# Patient Record
Sex: Male | Born: 1990 | Race: Black or African American | Hispanic: No | Marital: Married | Smoking: Current every day smoker
Health system: Southern US, Community
[De-identification: ages and names within clinical notes are randomized; demographics above are authoritative.]

## PROBLEM LIST (undated history)

## (undated) DIAGNOSIS — I10 Essential (primary) hypertension: Secondary | ICD-10-CM

## (undated) DIAGNOSIS — G4730 Sleep apnea, unspecified: Secondary | ICD-10-CM

## (undated) HISTORY — PX: NOSE SURGERY: SHX723

## (undated) HISTORY — PX: NASAL SINUS SURGERY: SHX719

---
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
## 2016-09-18 ENCOUNTER — Encounter (HOSPITAL_COMMUNITY): Payer: Self-pay | Admitting: Emergency Medicine

## 2016-09-18 ENCOUNTER — Ambulatory Visit (HOSPITAL_COMMUNITY)
Admission: EM | Admit: 2016-09-18 | Discharge: 2016-09-18 | Disposition: A | Discharge status: Home / Self Care | Payer: 59

## 2016-09-18 DIAGNOSIS — R197 Diarrhea, unspecified: Secondary | ICD-10-CM | POA: Diagnosis not present

## 2016-09-18 NOTE — ED Triage Notes (Signed)
Onset Monday night of fever, then diarrhea.  Fever has been intermittent since onset.  Denies vomiting.  No one else sick at home

## 2016-09-18 NOTE — ED Provider Notes (Signed)
CSN: [id number]     Arrival date & time 09/18/16  1450 History   None    Chief Complaint  Patient presents with  . Diarrhea   (Consider location/radiation/quality/duration/timing/severity/associated sxs/prior Treatment) Patient c/o fever 2 days ago. Patient is c/o diarrhea at present.  Denies nausea.   The history is provided by the patient.  Diarrhea  Quality:  Watery Severity:  Moderate Onset quality:  Sudden Duration:  2 days Timing:  Intermittent Relieved by:  Nothing Worsened by:  Nothing Ineffective treatments:  None tried   History reviewed. No pertinent past medical history. Past Surgical History:  Procedure Laterality Date  . NASAL SINUS SURGERY     No family history on file. Social History  Substance Use Topics  . Smoking status: Never Smoker  . Smokeless tobacco: Not on file  . Alcohol use Yes    Review of Systems  Constitutional: Negative.   HENT: Negative.   Eyes: Negative.   Respiratory: Negative.   Cardiovascular: Negative.   Gastrointestinal: Positive for diarrhea.  Endocrine: Negative.   Genitourinary: Negative.   Musculoskeletal: Negative.   Allergic/Immunologic: Negative.   Neurological: Negative.   Hematological: Negative.   Psychiatric/Behavioral: Negative.     Allergies  Amoxicillin  Home Medications   Prior to Admission medications   Not on File   Meds Ordered and Administered this Visit  Medications - No data to display  BP (!) 146/99 (BP Location: Left Arm) Comment (BP Location): large cuff  Pulse 90   Temp 99.1 F (37.3 C) (Oral)   Resp (!) 22   SpO2 100%  No data found.   Physical Exam  Constitutional: He appears well-developed and well-nourished.  HENT:  Head: Normocephalic and atraumatic.  Eyes: Conjunctivae and EOM are normal. Pupils are equal, round, and reactive to light.  Neck: Normal range of motion. Neck supple.  Cardiovascular: Normal rate, regular rhythm and normal heart sounds.   Pulmonary/Chest:  Effort normal and breath sounds normal.  Abdominal: Soft. Bowel sounds are normal.  Nursing note and vitals reviewed.   Urgent Care Course     Procedures (including critical care time)  Labs Review Labs Reviewed - No data to display  Imaging Review No results found.   Visual Acuity Review  Right Eye Distance:   Left Eye Distance:   Bilateral Distance:    Right Eye Near:   Left Eye Near:    Bilateral Near:         MDM   1. Diarrhea, unspecified type    Reassurance Advised patient to take metamucil otc and probiotics  Note for work      Deatra Canter, [place] 09/18/16 1601

## 2017-06-06 ENCOUNTER — Encounter (HOSPITAL_COMMUNITY): Payer: Self-pay | Admitting: Emergency Medicine

## 2017-06-06 ENCOUNTER — Other Ambulatory Visit: Payer: Self-pay

## 2017-06-06 ENCOUNTER — Emergency Department (HOSPITAL_COMMUNITY)
Admission: EM | Admit: 2017-06-06 | Discharge: 2017-06-06 | Disposition: A | Discharge status: Home / Self Care | Payer: 59 | Attending: Emergency Medicine | Admitting: Emergency Medicine

## 2017-06-06 DIAGNOSIS — B9789 Other viral agents as the cause of diseases classified elsewhere: Secondary | ICD-10-CM

## 2017-06-06 DIAGNOSIS — J029 Acute pharyngitis, unspecified: Secondary | ICD-10-CM | POA: Insufficient documentation

## 2017-06-06 DIAGNOSIS — J069 Acute upper respiratory infection, unspecified: Secondary | ICD-10-CM | POA: Insufficient documentation

## 2017-06-06 LAB — RAPID STREP SCREEN (MED CTR MEBANE ONLY): Streptococcus, Group A Screen (Direct): NEGATIVE

## 2017-06-06 NOTE — Discharge Instructions (Signed)
Alternate between Tylenol and ibuprofen as needed for pain or fever. Gargle warm salt water and spit it out. It is very important to stay hydrated!  Follow up with your primary care doctor in 5-7 days for recheck of ongoing symptoms and return to emergency department if any new or worsening of symptoms develop or you have any additional concerns.

## 2017-06-06 NOTE — ED Provider Notes (Signed)
[facility]ERGENCY [facility] Provider Note   CSN: [id number] Arrival date & time: 06/06/17  2144     History   Chief Complaint Chief Complaint  Patient presents with  . Sore Throat    Nasal Congestion   . Cough    HPI Margarette AsalDashawn J Hribar is a 26 y.o. male.  The history is provided by the patient and medical records. No language interpreter was used.  Sore Throat  Pertinent negatives include no chest pain, no abdominal pain and no shortness of breath.  Cough  Associated symptoms include sore throat. Pertinent negatives include no chest pain, no ear pain, no myalgias and no shortness of breath.   Margarette AsalDashawn J Madrazo is an otherwise healthy 26 y.o. male who presents to the Emergency Department complaining of sore throat which began yesterday.  Associated symptoms include intermittently productive cough and nasal congestion.  He endorses having temperature of 101 oral at home yesterday.  He has taken no antipyretic medications.  Temperature of 98.4 in triage and reports having no temperature during the day today at home either.  He has been taking over-the-counter allergy medication and NyQuil with some relief.  He reports working outside in the 30 degree temperature and states that when he went to work today, the cold weather exacerbated his symptoms and he informed his boss he needed to leave, prompting him to come to the emergency department today.  No known sick contacts.  History reviewed. No pertinent past medical history.  There are no active problems to display for this patient.   Past Surgical History:  Procedure Laterality Date  . NASAL SINUS SURGERY         Home Medications    Prior to Admission medications   Not on File    Family History No family history on file.  Social History Social History   Tobacco Use  . Smoking status: Never Smoker  . Smokeless tobacco: Never Used  Substance Use Topics  . Alcohol use: Yes  . Drug use: No      Allergies   Amoxicillin   Review of Systems Review of Systems  Constitutional: Positive for fever.  HENT: Positive for congestion and sore throat. Negative for ear pain and trouble swallowing.   Respiratory: Positive for cough. Negative for shortness of breath.   Cardiovascular: Negative for chest pain.  Gastrointestinal: Negative for abdominal pain, diarrhea, nausea and vomiting.  Musculoskeletal: Negative for myalgias.  Skin: Negative for rash.  Neurological: Negative for weakness.     Physical Exam Updated Vital Signs BP (!) 153/88 (BP Location: Right Arm)   Pulse 90   Temp 98.4 F (36.9 C) (Oral)   Resp 18   SpO2 98%   Physical Exam  Constitutional: He is oriented to person, place, and time. He appears well-developed and well-nourished. No distress.  HENT:  Head: Normocephalic and atraumatic.  OP with erythema, no exudates or tonsillar hypertrophy. + nasal congestion with mucosal edema.   Neck: Normal range of motion. Neck supple.  Cardiovascular: Normal rate, regular rhythm and normal heart sounds.  Pulmonary/Chest: Effort normal.  Lungs are clear to auscultation bilaterally - no w/r/r  Abdominal: Soft. He exhibits no distension. There is no tenderness.  Musculoskeletal: Normal range of motion.  Neurological: He is alert and oriented to person, place, and time.  Skin: Skin is warm and dry. He is not diaphoretic.  Nursing note and vitals reviewed.    ED Treatments / Results  Labs (all labs ordered are  listed, but only abnormal results are displayed) Labs Reviewed  RAPID STREP SCREEN (NOT AT [facility])  CULTURE, GROUP A STREP [facility])    EKG  EKG Interpretation None       Radiology No results found.  Procedures Procedures (including critical care time)  Medications Ordered in ED Medications - No data to display   Initial Impression / Assessment and Plan / ED Course  I have reviewed the triage vital signs and the nursing notes.  Pertinent labs  & imaging results that were available during my care of the patient were reviewed by me and considered in my medical decision making (see chart for details).    Margarette AsalDashawn J Stetler is a 26 y.o. male who presents to ED for sore throat and congestion which began yesterday.  On exam, patient is afebrile, hemodynamically stable with mild erythema to the oropharynx, but no exudates or tonsillar hypertrophy.  Lungs are clear to auscultation bilaterally.  Rapid strep negative.  Likely viral etiology.  Symptomatic home care instructions discussed.  PCP follow-up if no improvement.  All questions answered.   Final Clinical Impressions(s) / ED Diagnoses   Final diagnoses:  Viral URI with cough    ED Discharge Orders    None       Ward, Chase PicketJaime Pilcher, PA-C 06/06/17 2331    Mesner, Barbara CowerJason, MD 06/07/17 [phone]

## 2017-06-06 NOTE — ED Notes (Signed)
Unable to locate pt. at waiting area several times by staff.  

## 2017-06-06 NOTE — ED Triage Notes (Signed)
Patient presents with multiple complaints : sore throat , fever , productive cough and nasal congestion onset yesterday unrelieved by OTC medications .

## 2017-06-09 LAB — CULTURE, GROUP A STREP (THRC)

## 2017-09-18 ENCOUNTER — Encounter (HOSPITAL_COMMUNITY): Payer: Self-pay

## 2017-09-18 ENCOUNTER — Emergency Department (HOSPITAL_COMMUNITY)
Admission: EM | Admit: 2017-09-18 | Discharge: 2017-09-18 | Disposition: A | Discharge status: Home / Self Care | Payer: Managed Care, Other (non HMO) | Attending: Emergency Medicine | Admitting: Emergency Medicine

## 2017-09-18 DIAGNOSIS — R21 Rash and other nonspecific skin eruption: Secondary | ICD-10-CM | POA: Diagnosis not present

## 2017-09-18 MED ORDER — VALACYCLOVIR HCL 1 G PO TABS: 1000.0000 mg | ORAL_TABLET | Freq: Three times a day (TID) | ORAL | 0 refills | Status: DC

## 2017-09-18 MED ORDER — MUPIROCIN CALCIUM 2 % EX CREA: 1.0000 "application " | TOPICAL_CREAM | Freq: Two times a day (BID) | CUTANEOUS | 0 refills | Status: DC

## 2017-09-18 NOTE — ED Triage Notes (Signed)
Pt has a rash under his R breast that has been there for three days, painful, possibly shingles?

## 2017-09-18 NOTE — Discharge Instructions (Signed)
Take Valtrex until completed for suspected shingles. Apply mupirocin cream twice daily to affected area. Try to clean the affected areas well. Shower after sweating as soon as possible. Please return to the emergency department if you develop any new or worsening symptoms.

## 2017-09-18 NOTE — ED Provider Notes (Signed)
[facility] EMERGENCY DEPARTMENT Provider Note   CSN: [id number] Arrival date & time: 09/18/17  0144     History   Chief Complaint Chief Complaint  Patient presents with  . Rash    HPI Wesley Wheeler is a 27 y.o. male who presents with a 2-3-day history of rash under his right breast.  He reports the rash is mildly painful.  He denies any drainage.  He is not tried anything at home for symptoms.  He denies any fever or other symptoms.  It is not itchy.  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  He has not been  under any increased stress recently.  He has had chickenpox in his life.  He denies any other chest pain, shortness of breath, abdominal pain, nausea, vomiting.  HPI  History reviewed. No pertinent past medical history.  There are no active problems to display for this patient.   Past Surgical History:  Procedure Laterality Date  . NASAL SINUS SURGERY          Home Medications    Prior to Admission medications   Medication Sig Start Date End Date Taking? Authorizing Provider  mupirocin cream (BACTROBAN) 2 % Apply 1 application topically 2 (two) times daily. 09/18/17   Emeril Stille, Waylan Boga, PA-C  valACYclovir (VALTREX) 1000 MG tablet Take 1 tablet (1,000 mg total) by mouth 3 (three) times daily. 09/18/17   Emi Holes, PA-C    Family History No family history on file.  Social History Social History   Tobacco Use  . Smoking status: Never Smoker  . Smokeless tobacco: Never Used  Substance Use Topics  . Alcohol use: Yes  . Drug use: No     Allergies   Amoxicillin   Review of Systems Review of Systems  Constitutional: Negative for fever.  Respiratory: Negative for shortness of breath.   Cardiovascular: Negative for chest pain.  Gastrointestinal: Negative for abdominal pain, nausea and vomiting.  Skin: Positive for rash.     Physical Exam Updated Vital Signs BP 140/75   Pulse 89   Temp 98 F (36.7 C) (Oral)    Resp 20   SpO2 99%   Physical Exam  Constitutional: He appears well-developed and well-nourished. No distress.  HENT:  Head: Normocephalic and atraumatic.  Mouth/Throat: Oropharynx is clear and moist. No oropharyngeal exudate.  Eyes: Pupils are equal, round, and reactive to light. Conjunctivae are normal. Right eye exhibits no discharge. Left eye exhibits no discharge. No scleral icterus.  Neck: Normal range of motion. Neck supple. No thyromegaly present.  Cardiovascular: Normal rate, regular rhythm, normal heart sounds and intact distal pulses. Exam reveals no gallop and no friction rub.  No murmur heard. Pulmonary/Chest: Effort normal and breath sounds normal. No stridor. No respiratory distress. He has no wheezes. He has no rales.  Abdominal: Soft. Bowel sounds are normal. He exhibits no distension. There is no tenderness. There is no rebound and no guarding.  Musculoskeletal: He exhibits no edema.  Lymphadenopathy:    He has no cervical adenopathy.  Neurological: He is alert. Coordination normal.  Skin: Skin is warm and dry. No rash noted. He is not diaphoretic. No pallor.  Vesicular rash with mild erythematous base under the right breast, does not cross midline, painful only on palpation of one aspect, no tenderness to light touch, some vesicles seem to have a blackhead, others seem to have clear; no drainage; see photo  Psychiatric: He has a  normal mood and affect.  Nursing note and vitals reviewed.      ED Treatments / Results  Labs (all labs ordered are listed, but only abnormal results are displayed) Labs Reviewed - No data to display  EKG None  Radiology No results found.  Procedures Procedures (including critical care time)  Medications Ordered in ED Medications - No data to display   Initial Impression / Assessment and Plan / ED Course  I have reviewed the triage vital signs and the nursing notes.  Pertinent labs & imaging results that were available  during my care of the patient were reviewed by me and considered in my medical decision making (see chart for details).     Patient with rash for the past 3 days.  It is painful in certain areas.  Considering the distribution and appearance, concern for herpes zoster versus folliculitis.  Will treat with Valtrex and also cover with mupirocin.  Patient not having significant pain.  No signs SJS or TEN, or other emergent skin condition.  Return precautions discussed.  Patient understands and agrees with plan.  Patient vitals stable throughout ED course and discharged in satisfactory condition.  Final Clinical Impressions(s) / ED Diagnoses   Final diagnoses:  Rash and nonspecific skin eruption    ED Discharge Orders        Ordered    valACYclovir (VALTREX) 1000 MG tablet  3 times daily     09/18/17 0252    mupirocin cream (BACTROBAN) 2 %  2 times daily     09/18/17 0252       Emi HolesLaw, Kimanh Templeman M, PA-C 09/18/17 [id number]    Alvira MondaySchlossman, Erin, MD 09/20/17 [phone]

## 2019-04-10 ENCOUNTER — Emergency Department (HOSPITAL_COMMUNITY)
Admission: EM | Admit: 2019-04-10 | Discharge: 2019-04-10 | Disposition: A | Discharge status: Home / Self Care | Payer: Managed Care, Other (non HMO) | Attending: Emergency Medicine | Admitting: Emergency Medicine

## 2019-04-10 ENCOUNTER — Other Ambulatory Visit: Payer: Self-pay

## 2019-04-10 ENCOUNTER — Encounter (HOSPITAL_COMMUNITY): Payer: Self-pay | Admitting: Emergency Medicine

## 2019-04-10 DIAGNOSIS — R519 Headache, unspecified: Secondary | ICD-10-CM | POA: Diagnosis not present

## 2019-04-10 DIAGNOSIS — Z5321 Procedure and treatment not carried out due to patient leaving prior to being seen by health care provider: Secondary | ICD-10-CM | POA: Insufficient documentation

## 2019-04-10 DIAGNOSIS — R05 Cough: Secondary | ICD-10-CM | POA: Diagnosis present

## 2019-04-10 NOTE — ED Notes (Signed)
Pt in waiting room and asked "what the hell was taking so long?" in reference to being seen. As this EMT tried to talk with pt, pt called this EMT a "lazy bitch" who "just runs back and forth pretending to be busy." This EMT asked security to speak with pt. Pt continued to yell and was asked not to yell at staff. Pt sat for a few minutes longer before leaving [facility]. RN notified.

## 2019-04-10 NOTE — ED Triage Notes (Signed)
C/o productive cough with yellow phlegm and headache since yesterday.

## 2019-04-11 ENCOUNTER — Other Ambulatory Visit: Payer: Self-pay

## 2019-04-11 ENCOUNTER — Ambulatory Visit (INDEPENDENT_AMBULATORY_CARE_PROVIDER_SITE_OTHER): Payer: Managed Care, Other (non HMO)

## 2019-04-11 ENCOUNTER — Ambulatory Visit (HOSPITAL_COMMUNITY)
Admission: EM | Admit: 2019-04-11 | Discharge: 2019-04-11 | Disposition: A | Discharge status: Home / Self Care | Payer: Managed Care, Other (non HMO) | Attending: Emergency Medicine | Admitting: Emergency Medicine

## 2019-04-11 ENCOUNTER — Encounter (HOSPITAL_COMMUNITY): Payer: Self-pay | Admitting: Emergency Medicine

## 2019-04-11 DIAGNOSIS — R0602 Shortness of breath: Secondary | ICD-10-CM | POA: Diagnosis not present

## 2019-04-11 DIAGNOSIS — R05 Cough: Secondary | ICD-10-CM | POA: Diagnosis not present

## 2019-04-11 DIAGNOSIS — R062 Wheezing: Secondary | ICD-10-CM

## 2019-04-11 DIAGNOSIS — Z79899 Other long term (current) drug therapy: Secondary | ICD-10-CM | POA: Insufficient documentation

## 2019-04-11 DIAGNOSIS — Z20828 Contact with and (suspected) exposure to other viral communicable diseases: Secondary | ICD-10-CM | POA: Insufficient documentation

## 2019-04-11 DIAGNOSIS — R0981 Nasal congestion: Secondary | ICD-10-CM | POA: Diagnosis not present

## 2019-04-11 DIAGNOSIS — F17200 Nicotine dependence, unspecified, uncomplicated: Secondary | ICD-10-CM | POA: Insufficient documentation

## 2019-04-11 DIAGNOSIS — J3489 Other specified disorders of nose and nasal sinuses: Secondary | ICD-10-CM

## 2019-04-11 DIAGNOSIS — J40 Bronchitis, not specified as acute or chronic: Secondary | ICD-10-CM | POA: Insufficient documentation

## 2019-04-11 DIAGNOSIS — Z880 Allergy status to penicillin: Secondary | ICD-10-CM | POA: Insufficient documentation

## 2019-04-11 DIAGNOSIS — Z833 Family history of diabetes mellitus: Secondary | ICD-10-CM | POA: Insufficient documentation

## 2019-04-11 DIAGNOSIS — R0789 Other chest pain: Secondary | ICD-10-CM

## 2019-04-11 MED ORDER — HYDROCOD POLST-CPM POLST ER 10-8 MG/5ML PO SUER: 5.0000 mL | Freq: Two times a day (BID) | ORAL | 0 refills | Status: DC | PRN

## 2019-04-11 MED ORDER — FLUTICASONE PROPIONATE 50 MCG/ACT NA SUSP: 2.0000 | Freq: Every day | NASAL | 0 refills | Status: DC

## 2019-04-11 MED ORDER — AEROCHAMBER PLUS MISC: 2 refills | Status: DC

## 2019-04-11 MED ORDER — ALBUTEROL SULFATE HFA 108 (90 BASE) MCG/ACT IN AERS: 1.0000 | INHALATION_SPRAY | Freq: Four times a day (QID) | RESPIRATORY_TRACT | 0 refills | Status: DC | PRN

## 2019-04-11 MED ORDER — BENZONATATE 200 MG PO CAPS: 200.0000 mg | ORAL_CAPSULE | Freq: Three times a day (TID) | ORAL | 0 refills | Status: DC | PRN

## 2019-04-11 NOTE — ED Provider Notes (Signed)
HPI  SUBJECTIVE:  Wesley Wheeler is a 28 y.o. male who presents with 3 days of a cough productive of thick yellow phlegm, nasal congestion, rhinorrhea.  He had a sore throat and headache yesterday, but this has resolved.  He reports wheezing, and shortness of breath with coughing only.  He reports central chest pain present only with breathing described as soreness.  This started today.  He has tried Benadryl without improvement in symptoms.  There are no other aggravating or alleviating factors.  No fevers, postnasal drip, sinus pain or pressure, body aches.  No nausea, vomiting, abdominal pain, diarrhea.  No loss of sense of smell or taste.  No known exposure to Covid.  No antibiotics in the past 3 months.  No antipyretic in the past 4 to 6 hours.  No calf pain, swelling, prolonged immobilization, surgery in the past 4 weeks, hemoptysis.  He is a smoker.  No history of asthma, emphysema, COPD, diabetes, hypertension, MI, coronary disease, hypercholesterolemia, PE, DVT, chronic kidney disease, immunocompromise or HIV.  PMD: None.    History reviewed. No pertinent past medical history.  Past Surgical History:  Procedure Laterality Date  . NASAL SINUS SURGERY    . NOSE SURGERY     benign tumor    Family History  Problem Relation Age of Onset  . Hypertension Mother   . Diabetes Father     Social History   Tobacco Use  . Smoking status: Current Every Day Smoker  . Smokeless tobacco: Never Used  Substance Use Topics  . Alcohol use: Yes  . Drug use: No    No current facility-administered medications for this encounter.   Current Outpatient Medications:  .  albuterol (VENTOLIN HFA) 108 (90 Base) MCG/ACT inhaler, Inhale 1-2 puffs into the lungs every 6 (six) hours as needed for wheezing or shortness of breath., Disp: 18 g, Rfl: 0 .  benzonatate (TESSALON) 200 MG capsule, Take 1 capsule (200 mg total) by mouth 3 (three) times daily as needed for cough., Disp: 30 capsule, Rfl: 0 .   chlorpheniramine-HYDROcodone (TUSSIONEX PENNKINETIC ER) 10-8 MG/5ML SUER, Take 5 mLs by mouth every 12 (twelve) hours as needed for cough., Disp: 60 mL, Rfl: 0 .  fluticasone (FLONASE) 50 MCG/ACT nasal spray, Place 2 sprays into both nostrils daily., Disp: 16 g, Rfl: 0 .  Spacer/Aero-Holding Chambers (AEROCHAMBER PLUS) inhaler, Use as instructed, Disp: 1 each, Rfl: 2 .  valACYclovir (VALTREX) 1000 MG tablet, Take 1 tablet (1,000 mg total) by mouth 3 (three) times daily., Disp: 21 tablet, Rfl: 0  Allergies  Allergen Reactions  . Amoxicillin      ROS  As noted in HPI.   Physical Exam  BP (!) 154/98 (BP Location: Left Arm)   Pulse (!) 101   Temp 98.6 F (37 C) (Oral)   Resp 20   SpO2 96%   Constitutional: Well developed, well nourished, no acute distress Eyes:  EOMI, conjunctiva normal bilaterally HENT: Normocephalic, atraumatic,mucus membranes moist positive mucoid nasal congestion.  Erythematous, but not swollen turbinates.  No sinus tenderness.  No obvious postnasal drip. Respiratory: Fair inspiratory effort.  Rhonchi on the left side that do not clear with the coughing.  Positive wheezing left side.  No chest wall tenderness Cardiovascular: Mild regular tachycardia, no murmurs rubs or gallops GI: nondistended skin: No rash, skin intact Musculoskeletal: no deformities.  Calves symmetric, nontender, no edema.  No palpable cord. Neurologic: Alert & oriented x 3, no focal neuro deficits Psychiatric: Speech and behavior  appropriate   ED Course   Medications - No data to display  Orders Placed This Encounter  Procedures  . Novel Coronavirus, NAA (Hosp order, Send-out to Ref Lab; TAT 18-24 hrs    Standing Status:   Standing    Number of Occurrences:   1    Order Specific Question:   Is this test for diagnosis or screening    Answer:   Screening    Order Specific Question:   Symptomatic for COVID-19 as defined by CDC    Answer:   No    Order Specific Question:    Hospitalized for COVID-19    Answer:   No    Order Specific Question:   Admitted to ICU for COVID-19    Answer:   No    Order Specific Question:   Previously tested for COVID-19    Answer:   No    Order Specific Question:   Resident in a congregate (group) care setting    Answer:   No    Order Specific Question:   Employed in healthcare setting    Answer:   No  . DG Chest 2 View    Standing Status:   Standing    Number of Occurrences:   1    Order Specific Question:   Reason for Exam (SYMPTOM  OR DIAGNOSIS REQUIRED)    Answer:   r/o PNA    No results found for this or any previous visit (from the past 24 hour(s)). Dg Chest 2 View  Result Date: 04/11/2019 CLINICAL DATA:  Cough and tachycardia EXAM: CHEST - 2 VIEW COMPARISON:  None. FINDINGS: Lungs are clear. Heart is upper normal in size with pulmonary vascularity normal. No adenopathy. No bone lesions. IMPRESSION: No edema or consolidation.  Heart upper normal in size. Electronically Signed   By: Bretta BangWilliam  Woodruff III M.D.   On: 04/11/2019 11:40    ED Clinical Impression  1. Bronchitis      ED Assessment/Plan  Suspect bronchitis versus lower respiratory tract infection.  Covid test sent.  Checking x-ray to rule out pneumonia.  If negative, will send home with regularly scheduled albuterol with a spacer, for the next 4 days, then may use it as needed, Flonase, discontinue Benadryl, start Mucinex D, Tussionex for the cough at night, Tessalon for the cough during the day.  Will provide primary care list for ongoing care.  [place] Narcotic database reviewed for this patient, and feel that the risk/benefit ratio today is favorable for proceeding with a prescription for controlled substance.  No opiates in the past 2 years  Reviewed imaging independently and discussed with radiology.  No edema or consolidation.  Lungs are clear.  No evidence of pneumonia.  See radiology report for full details.  Plan as above.  Discussed imaging, MDM,  treatment plan, and plan for follow-up with patient. Discussed sn/sx that should prompt return to the ED. patient agrees with plan.   Meds ordered this encounter  Medications  . DISCONTD: chlorpheniramine-HYDROcodone (TUSSIONEX PENNKINETIC ER) 10-8 MG/5ML SUER    Sig: Take 5 mLs by mouth every 12 (twelve) hours as needed for cough.    Dispense:  60 mL    Refill:  0  . fluticasone (FLONASE) 50 MCG/ACT nasal spray    Sig: Place 2 sprays into both nostrils daily.    Dispense:  16 g    Refill:  0  . albuterol (VENTOLIN HFA) 108 (90 Base) MCG/ACT inhaler    Sig: Inhale 1-2  puffs into the lungs every 6 (six) hours as needed for wheezing or shortness of breath.    Dispense:  18 g    Refill:  0  . Spacer/Aero-Holding Chambers (AEROCHAMBER PLUS) inhaler    Sig: Use as instructed    Dispense:  1 each    Refill:  2  . benzonatate (TESSALON) 200 MG capsule    Sig: Take 1 capsule (200 mg total) by mouth 3 (three) times daily as needed for cough.    Dispense:  30 capsule    Refill:  0  . chlorpheniramine-HYDROcodone (TUSSIONEX PENNKINETIC ER) 10-8 MG/5ML SUER    Sig: Take 5 mLs by mouth every 12 (twelve) hours as needed for cough.    Dispense:  60 mL    Refill:  0    *This clinic note was created using Scientist, clinical (histocompatibility and immunogenetics). Therefore, there may be occasional mistakes despite careful proofreading.   ?    Domenick Gong, MD 04/11/19 1203

## 2019-04-11 NOTE — ED Triage Notes (Signed)
Complaints of chest congestion and cough.  Symptoms started Friday with a sore throat, no sore throat now.  Patient has had a headache.  Productive cough "golden" thick.   Denies fever.   Very specific central chest pain.  Hurts worse with inhale and exhale to any extreme.    Sob after coughing episodes

## 2019-04-11 NOTE — Discharge Instructions (Addendum)
2 puffs from your albuterol every 4 hours for the next 2 days, and then every 6 hours for 2 days after that, then you may use it as needed.  Use your spacer with your inhaler.  Tessalon for the cough during the day, Tussionex for the cough at night.  Stop Benadryl, start Mucinex or Mucinex D.  Push plenty of extra fluids.  Flonase will help with your nasal congestion.  Your Covid test will take a day or 2 to come back.  Your chest x-ray was negative for pneumonia today, so I have decided to hold off on antibiotics for now.  If you are not getting any better in a week, return here and we can consider antibiotics at that time  Below is a list of primary care practices who are taking new patients for you to follow-up with.  [facility] at [facility] [address] [place], [place] [address] [phone]  [place], [place] [address] [phone]  [facility] [phone] [place] [address]  [facility]: [place]  [phone]  [place] [facility]: [place]   [phone]  [facility]: [address]  [place]  [phone]  [place] [facility] : [address] [place] [phone]  [facility]: [address] [place] [address] [phone]  [facility]: [place] [phone]  Dr. Blanchie Serve [place]  [phone]  Dr. Benito Mccreedy, [facility]. [place], [place] [address]  [phone]  Go to www.goodrx.com to look up your  medications. This will give you a list of where you can find your prescriptions at the most affordable prices. Or ask the pharmacist what the cash price is, or if they have any other discount programs available to help make your medication more affordable. This can be less expensive than what you would pay with insurance.

## 2019-04-12 LAB — NOVEL CORONAVIRUS, NAA (HOSP ORDER, SEND-OUT TO REF LAB; TAT 18-24 HRS): SARS-CoV-2, NAA: NOT DETECTED

## 2019-04-13 ENCOUNTER — Telehealth (HOSPITAL_COMMUNITY): Payer: Self-pay | Admitting: Emergency Medicine

## 2019-04-13 NOTE — Telephone Encounter (Signed)
Pt called asking about test results. Results reported as negative.   

## 2019-04-17 ENCOUNTER — Ambulatory Visit (HOSPITAL_COMMUNITY)
Admission: EM | Admit: 2019-04-17 | Discharge: 2019-04-17 | Disposition: A | Discharge status: Home / Self Care | Payer: Managed Care, Other (non HMO) | Attending: Internal Medicine | Admitting: Internal Medicine

## 2019-04-17 ENCOUNTER — Encounter (HOSPITAL_COMMUNITY): Payer: Self-pay

## 2019-04-17 DIAGNOSIS — J209 Acute bronchitis, unspecified: Secondary | ICD-10-CM | POA: Diagnosis not present

## 2019-04-17 MED ORDER — PREDNISONE 20 MG PO TABS: 20.0000 mg | ORAL_TABLET | Freq: Every day | ORAL | 0 refills | Status: AC

## 2019-04-17 NOTE — ED Provider Notes (Signed)
[place]    CSN: [id number] Arrival date & time: 04/17/19  1021      History   Chief Complaint Chief Complaint  Patient presents with  . Nasal Congestion  . Cough    HPI Wesley Wheeler is a 28 y.o. male with history of tobacco use (6-pack-year history of smoking) comes to urgent care  for cough and wheezing which started this morning.  Patient was seen in the urgent care about a week ago.  He was diagnosed with bronchitis and is currently being managed with bronchodilators, mucolytic's and antitussives agents.  His symptoms improved over the course of the week.  This morning he started wheezing.  At rest he denies any shortness of breath.  With activity he has some shortness of breath.  No chest pain or chest pressure.  No fever, chills.  Sputum is clear.  Patient denies any generalized body aches.  He tested negative for COVID-19 last week.  HPI  History reviewed. No pertinent past medical history.  There are no active problems to display for this patient.   Past Surgical History:  Procedure Laterality Date  . NASAL SINUS SURGERY    . NOSE SURGERY     benign tumor       Home Medications    Prior to Admission medications   Medication Sig Start Date End Date Taking? Authorizing Provider  albuterol (VENTOLIN HFA) 108 (90 Base) MCG/ACT inhaler Inhale 1-2 puffs into the lungs every 6 (six) hours as needed for wheezing or shortness of breath. 04/11/19   Melynda Ripple, MD  benzonatate (TESSALON) 200 MG capsule Take 1 capsule (200 mg total) by mouth 3 (three) times daily as needed for cough. 04/11/19   Melynda Ripple, MD  chlorpheniramine-HYDROcodone (TUSSIONEX PENNKINETIC ER) 10-8 MG/5ML SUER Take 5 mLs by mouth every 12 (twelve) hours as needed for cough. 04/11/19   Melynda Ripple, MD  fluticasone (FLONASE) 50 MCG/ACT nasal spray Place 2 sprays into both nostrils daily. 04/11/19   Melynda Ripple, MD  Spacer/Aero-Holding Chambers (AEROCHAMBER PLUS)  inhaler Use as instructed 04/11/19   Melynda Ripple, MD  valACYclovir (VALTREX) 1000 MG tablet Take 1 tablet (1,000 mg total) by mouth 3 (three) times daily. 09/18/17   Frederica Kuster, PA-C    Family History Family History  Problem Relation Age of Onset  . Hypertension Mother   . Diabetes Father     Social History Social History   Tobacco Use  . Smoking status: Current Every Day Smoker  . Smokeless tobacco: Never Used  Substance Use Topics  . Alcohol use: Yes  . Drug use: No     Allergies   Amoxicillin   Review of Systems Review of Systems  Constitutional: Negative.  Negative for activity change, chills and fever.  HENT: Positive for congestion. Negative for ear discharge, ear pain, facial swelling, postnasal drip, rhinorrhea, sinus pressure, sinus pain and sore throat.   Respiratory: Positive for cough, shortness of breath and wheezing. Negative for choking and chest tightness.   Cardiovascular: Negative.   Gastrointestinal: Negative.   Genitourinary: Negative.   Musculoskeletal: Negative.   Skin: Negative.   Neurological: Negative.      Physical Exam Triage Vital Signs ED Triage Vitals  Enc Vitals Group     BP 04/17/19 1040 (!) 152/102     Pulse Rate 04/17/19 1040 88     Resp 04/17/19 1040 18     Temp 04/17/19 1040 98.4 F (36.9 C)     Temp Source  04/17/19 1040 Skin     SpO2 04/17/19 1040 99 %     Weight --      Height --      Head Circumference --      Peak Flow --      Pain Score 04/17/19 1042 0     Pain Loc --      Pain Edu? --      Excl. in GC? --    No data found.  Updated Vital Signs BP (!) 152/102 (BP Location: Left Arm)   Pulse 88   Temp 98.4 F (36.9 C) (Skin)   Resp 18   SpO2 99%   Visual Acuity Right Eye Distance:   Left Eye Distance:   Bilateral Distance:    Right Eye Near:   Left Eye Near:    Bilateral Near:     Physical Exam Vitals signs and nursing note reviewed.  Constitutional:      General: He is not in acute  distress.    Appearance: He is ill-appearing.  HENT:     Right Ear: Tympanic membrane normal.     Left Ear: Tympanic membrane normal.     Nose: Congestion present.     Mouth/Throat:     Mouth: Mucous membranes are moist.     Pharynx: No oropharyngeal exudate or posterior oropharyngeal erythema.  Neck:     Musculoskeletal: Normal range of motion. No neck rigidity or muscular tenderness.  Cardiovascular:     Rate and Rhythm: Normal rate and regular rhythm.     Pulses: Normal pulses.     Heart sounds: Normal heart sounds. No murmur. No friction rub.  Pulmonary:     Effort: Pulmonary effort is normal.     Breath sounds: Wheezing, rhonchi and rales present.  Abdominal:     General: Abdomen is flat. Bowel sounds are normal. There is no distension.     Tenderness: There is no guarding or rebound.  Musculoskeletal: Normal range of motion.        General: No swelling or signs of injury.  Skin:    General: Skin is warm.     Capillary Refill: Capillary refill takes less than 2 seconds.  Neurological:     General: No focal deficit present.     Mental Status: He is alert.      UC Treatments / Results  Labs (all labs ordered are listed, but only abnormal results are displayed) Labs Reviewed - No data to display  EKG   Radiology No results found.  Procedures Procedures (including critical care time)  Medications Ordered in UC Medications - No data to display  Initial Impression / Assessment and Plan / UC Course  I have reviewed the triage vital signs and the nursing notes.  Pertinent labs & imaging results that were available during my care of the patient were reviewed by me and considered in my medical decision making (see chart for details).    1.  Acute bronchitis with bronchospasm: Prednisone 20 mg orally daily for 5 days Continue bronchodilator treatments Smoke cessation counseling given.  Patient is in the contemplative stage of smoke cessation.  Total time was 4  minutes. If patient's symptoms worsen he is advised to return to urgent care to be reevaluated. Final Clinical Impressions(s) / UC Diagnoses   Final diagnoses:  None   Discharge Instructions   None    ED Prescriptions    None     PDMP not reviewed this encounter.   Demaris Callander  O, MD 04/17/19 1115

## 2019-04-17 NOTE — ED Triage Notes (Signed)
Pt present chest congestion with a cough, symptoms started over a week ago. Pt has a covid 19 test last week result negative.

## 2019-04-28 ENCOUNTER — Encounter (HOSPITAL_COMMUNITY): Payer: Self-pay

## 2019-04-28 ENCOUNTER — Other Ambulatory Visit: Payer: Self-pay

## 2019-04-28 ENCOUNTER — Ambulatory Visit (HOSPITAL_COMMUNITY)
Admission: EM | Admit: 2019-04-28 | Discharge: 2019-04-28 | Disposition: A | Discharge status: Home / Self Care | Payer: Managed Care, Other (non HMO) | Attending: Family Medicine | Admitting: Family Medicine

## 2019-04-28 DIAGNOSIS — Z20828 Contact with and (suspected) exposure to other viral communicable diseases: Secondary | ICD-10-CM | POA: Diagnosis not present

## 2019-04-28 DIAGNOSIS — Z20822 Contact with and (suspected) exposure to covid-19: Secondary | ICD-10-CM

## 2019-04-28 NOTE — Discharge Instructions (Signed)
Self isolate until covid results are back and negative.  °Will notify you by phone of any positive findings. Your negative results will be sent through your MyChart.     ° °

## 2019-04-28 NOTE — ED Provider Notes (Signed)
[facility]    CSN: [id number] Arrival date & time: 04/28/19  1646      History   Chief Complaint Chief Complaint  Patient presents with  . covid test    HPI Wesley Wheeler is a 28 y.o. male.   Wesley Wheeler presents with requests for covid-19 testing. States his wife's, client's mother came in contact with Covid-19, therefore his wife's boss is recommending testing. He feels fine and his wife also feels fine. Denies any symptoms of covid-19. No other known contact or exposures. Without contributing medical history.     ROS per HPI, negative if not otherwise mentioned.      History reviewed. No pertinent past medical history.  There are no active problems to display for this patient.   Past Surgical History:  Procedure Laterality Date  . NASAL SINUS SURGERY    . NOSE SURGERY     benign tumor       Home Medications    Prior to Admission medications   Medication Sig Start Date End Date Taking? Authorizing Provider  albuterol (VENTOLIN HFA) 108 (90 Base) MCG/ACT inhaler Inhale 1-2 puffs into the lungs every 6 (six) hours as needed for wheezing or shortness of breath. 04/11/19   Domenick Gong, MD  benzonatate (TESSALON) 200 MG capsule Take 1 capsule (200 mg total) by mouth 3 (three) times daily as needed for cough. 04/11/19   Domenick Gong, MD  chlorpheniramine-HYDROcodone (TUSSIONEX PENNKINETIC ER) 10-8 MG/5ML SUER Take 5 mLs by mouth every 12 (twelve) hours as needed for cough. 04/11/19   Domenick Gong, MD  fluticasone (FLONASE) 50 MCG/ACT nasal spray Place 2 sprays into both nostrils daily. 04/11/19   Domenick Gong, MD  Spacer/Aero-Holding Chambers (AEROCHAMBER PLUS) inhaler Use as instructed 04/11/19   Domenick Gong, MD  valACYclovir (VALTREX) 1000 MG tablet Take 1 tablet (1,000 mg total) by mouth 3 (three) times daily. 09/18/17   Emi Holes, PA-C    Family History Family History  Problem Relation Age of Onset  .  Hypertension Mother   . Diabetes Father     Social History Social History   Tobacco Use  . Smoking status: Current Every Day Smoker  . Smokeless tobacco: Never Used  Substance Use Topics  . Alcohol use: Yes  . Drug use: No     Allergies   Amoxicillin   Review of Systems Review of Systems   Physical Exam Triage Vital Signs ED Triage Vitals  Enc Vitals Group     BP 04/28/19 1730 (!) 149/101     Pulse Rate 04/28/19 1730 78     Resp 04/28/19 1730 18     Temp 04/28/19 1730 97.8 F (36.6 C)     Temp Source 04/28/19 1730 Temporal     SpO2 04/28/19 1730 100 %     Weight --      Height --      Head Circumference --      Peak Flow --      Pain Score 04/28/19 1745 0     Pain Loc --      Pain Edu? --      Excl. in GC? --    No data found.  Updated Vital Signs BP (!) 149/101 (BP Location: Left Arm)   Pulse 78   Temp 97.8 F (36.6 C) (Temporal)   Resp 18   SpO2 100%    Physical Exam Constitutional:      Appearance: He is well-developed.  Cardiovascular:  Rate and Rhythm: Normal rate.  Pulmonary:     Effort: Pulmonary effort is normal.  Skin:    General: Skin is warm and dry.  Neurological:     Mental Status: He is alert and oriented to person, place, and time.      UC Treatments / Results  Labs (all labs ordered are listed, but only abnormal results are displayed) Labs Reviewed  NOVEL CORONAVIRUS, NAA (HOSP ORDER, SEND-OUT TO REF LAB; TAT 18-24 HRS)    EKG   Radiology No results found.  Procedures Procedures (including critical care time)  Medications Ordered in UC Medications - No data to display  Initial Impression / Assessment and Plan / UC Course  I have reviewed the triage vital signs and the nursing notes.  Pertinent labs & imaging results that were available during my care of the patient were reviewed by me and considered in my medical decision making (see chart for details).     Asymptomatic of covid-19. Concern for  exposure. Testing collected and pending. Return precautions provided. Patient verbalized understanding and agreeable to plan.   Final Clinical Impressions(s) / UC Diagnoses   Final diagnoses:  Encounter for laboratory testing for COVID-19 virus     Discharge Instructions     Self isolate until covid results are back and negative.  Will notify you by phone of any positive findings. Your negative results will be sent through your MyChart.       ED Prescriptions    None     PDMP not reviewed this encounter.   Zigmund Gottron, NP 04/28/19 1756

## 2019-04-30 LAB — NOVEL CORONAVIRUS, NAA (HOSP ORDER, SEND-OUT TO REF LAB; TAT 18-24 HRS): SARS-CoV-2, NAA: NOT DETECTED

## 2019-07-21 ENCOUNTER — Ambulatory Visit (HOSPITAL_COMMUNITY)
Admission: EM | Admit: 2019-07-21 | Discharge: 2019-07-21 | Disposition: A | Discharge status: Home / Self Care | Payer: Managed Care, Other (non HMO) | Attending: Family Medicine | Admitting: Family Medicine

## 2019-07-21 ENCOUNTER — Other Ambulatory Visit: Payer: Self-pay

## 2019-07-21 ENCOUNTER — Encounter (HOSPITAL_COMMUNITY): Payer: Self-pay | Admitting: Emergency Medicine

## 2019-07-21 DIAGNOSIS — R030 Elevated blood-pressure reading, without diagnosis of hypertension: Secondary | ICD-10-CM | POA: Insufficient documentation

## 2019-07-21 DIAGNOSIS — Z20822 Contact with and (suspected) exposure to covid-19: Secondary | ICD-10-CM | POA: Insufficient documentation

## 2019-07-21 NOTE — ED Triage Notes (Signed)
Pt's wife tested positive for Covid, found out results today.  Pt is not having any symptoms at this time, but needs to be tested for work.

## 2019-07-21 NOTE — ED Provider Notes (Signed)
[facility]   [id number] 07/21/19 Arrival Time: 0944  ASSESSMENT & PLAN:  1. Close exposure to COVID-19 virus   2. Elevated blood pressure reading without diagnosis of hypertension      COVID-19 testing sent. See letter/work note on file for self-isolation guidelines.   Follow-up Information    [facility]   Specialty: Urgent Care Why: As needed. Contact information: [address] [place] [address] [phone]           Discharge Instructions     You have been tested for COVID-19 today. If your test returns positive, you will receive a phone call from [facility] regarding your results. Negative test results are not called. Both positive and negative results area always visible on MyChart. If you do not have a MyChart account, sign up instructions are provided in your discharge papers. Please do not hesitate to contact us should you have questions or concerns.  Your blood pressure was noted to be elevated during your visit today. You may return here within the next few days to recheck if unable to see your primary care doctor. If your blood pressure remains persistently elevated, you may need to begin taking a medication.  BP (!) 175/126 (BP Location: Right Wrist) Comment: standard cuff  Pulse 89   Temp 98.2 F (36.8 C) (Oral)   Resp 18   SpO2 98%       Reviewed expectations re: course of current medical issues. Questions answered. Outlined signs and symptoms indicating need for more acute intervention. Patient verbalized understanding. After Visit Summary given.   SUBJECTIVE: History from: patient. Wesley Wheeler is a 29 y.o. male who requests COVID-19 testing. Known COVID-19 contact: wife tested positive yesterday. Recent travel: none. Denies: runny nose, congestion, fever, cough, sore throat, difficulty breathing and headache. Normal PO intake without n/v/d.  Increased blood pressure  noted today. Reports that he has not been treated for hypertension in the past.  He reports no chest pain on exertion, no dyspnea on exertion, no swelling of ankles, no orthostatic dizziness or lightheadedness, no orthopnea or paroxysmal nocturnal dyspnea, no palpitations and no intermittent claudication symptoms. Does occasionally check BP outside of medical office; reports 'always normal'.    OBJECTIVE:  Vitals:   07/21/19 1027 07/21/19 1030  BP: (!) 163/116 (!) 175/126  Pulse: 89   Resp: 18   Temp: 98.2 F (36.8 C)   TempSrc: Oral   SpO2: 98%     General appearance: alert; no distress Eyes: PERRLA; EOMI; conjunctiva normal HENT: [place]; AT; nasal mucosa normal; oral mucosa normal Neck: supple  Lungs: speaks full sentences without difficulty; unlabored Extremities: no edema Skin: warm and dry Neurologic: normal gait Psychological: alert and cooperative; normal mood and affect  Labs:  Labs Reviewed  NOVEL CORONAVIRUS, NAA (HOSP ORDER, SEND-OUT TO REF LAB; TAT 18-24 HRS)      Allergies  Allergen Reactions  . Amoxicillin     History reviewed. No pertinent past medical history.   Social History   Socioeconomic History  . Marital status: Single    Spouse name: Not on file  . Number of children: Not on file  . Years of education: Not on file  . Highest education level: Not on file  Occupational History  . Not on file  Tobacco Use  . Smoking status: Current Every Day Smoker  . Smokeless tobacco: Never Used  Substance and Sexual Activity  . Alcohol use: Yes  .  Drug use: No  . Sexual activity: Not on file  Other Topics Concern  . Not on file  Social History Narrative  . Not on file   Social Determinants of Health   Financial Resource Strain:   . Difficulty of Paying Living Expenses: Not on file  Food Insecurity:   . Worried About Charity fundraiser in the Last Year: Not on file  . Ran Out of Food in the Last Year: Not on file  Transportation Needs:   .  Lack of Transportation (Medical): Not on file  . Lack of Transportation (Non-Medical): Not on file  Physical Activity:   . Days of Exercise per Week: Not on file  . Minutes of Exercise per Session: Not on file  Stress:   . Feeling of Stress : Not on file  Social Connections:   . Frequency of Communication with Friends and Family: Not on file  . Frequency of Social Gatherings with Friends and Family: Not on file  . Attends Religious Services: Not on file  . Active Member of Clubs or Organizations: Not on file  . Attends Archivist Meetings: Not on file  . Marital Status: Not on file  Intimate Partner Violence:   . Fear of Current or Ex-Partner: Not on file  . Emotionally Abused: Not on file  . Physically Abused: Not on file  . Sexually Abused: Not on file   Family History  Problem Relation Age of Onset  . Hypertension Mother   . Diabetes Father    Past Surgical History:  Procedure Laterality Date  . NASAL SINUS SURGERY    . NOSE SURGERY     benign tumor     Vanessa Kick, MD 07/21/19 [phone]

## 2019-07-21 NOTE — Discharge Instructions (Addendum)
You have been tested for COVID-19 today. If your test returns positive, you will receive a phone call from [facility] regarding your results. Negative test results are not called. Both positive and negative results area always visible on MyChart. If you do not have a MyChart account, sign up instructions are provided in your discharge papers. Please do not hesitate to contact us should you have questions or concerns.  Your blood pressure was noted to be elevated during your visit today. You may return here within the next few days to recheck if unable to see your primary care doctor. If your blood pressure remains persistently elevated, you may need to begin taking a medication.  BP (!) 175/126 (BP Location: Right Wrist) Comment: standard cuff   Pulse 89    Temp 98.2 F (36.8 C) (Oral)    Resp 18    SpO2 98%

## 2019-07-23 LAB — NOVEL CORONAVIRUS, NAA (HOSP ORDER, SEND-OUT TO REF LAB; TAT 18-24 HRS): SARS-CoV-2, NAA: NOT DETECTED

## 2019-12-17 ENCOUNTER — Other Ambulatory Visit: Payer: Self-pay

## 2019-12-17 ENCOUNTER — Encounter (HOSPITAL_COMMUNITY): Payer: Self-pay | Admitting: Gynecology

## 2019-12-17 ENCOUNTER — Ambulatory Visit (HOSPITAL_COMMUNITY)
Admission: EM | Admit: 2019-12-17 | Discharge: 2019-12-17 | Disposition: A | Discharge status: Home / Self Care | Payer: Managed Care, Other (non HMO) | Attending: Family Medicine | Admitting: Family Medicine

## 2019-12-17 DIAGNOSIS — S39012A Strain of muscle, fascia and tendon of lower back, initial encounter: Secondary | ICD-10-CM

## 2019-12-17 HISTORY — DX: Essential (primary) hypertension: I10

## 2019-12-17 MED ORDER — HYDROCODONE-ACETAMINOPHEN 7.5-325 MG PO TABS: 1.0000 | ORAL_TABLET | Freq: Four times a day (QID) | ORAL | 0 refills | Status: DC | PRN

## 2019-12-17 MED ORDER — TIZANIDINE HCL 4 MG PO TABS: 4.0000 mg | ORAL_TABLET | Freq: Four times a day (QID) | ORAL | 0 refills | Status: DC | PRN

## 2019-12-17 NOTE — Discharge Instructions (Addendum)
Continue the diclofenac 2 x a day with food This is an anti inflammatory pain medicine Add tizanidine as needed muscle relaxer- this is useful at night Take the hydrocodone as needed pain. do not drive on the hydrocodone Ice, then heat to back Call if not improving by Monday.

## 2019-12-17 NOTE — ED Triage Notes (Signed)
Per patient while bending today believe he pulled a muscle in his lower back. Patient c/o lower back pain.

## 2019-12-17 NOTE — ED Provider Notes (Signed)
[facility]    CSN: [id number] Arrival date & time: 12/17/19  1700      History   Chief Complaint Chief Complaint  Patient presents with   Back Pain    HPI Wesley Wheeler is a 29 y.o. male.   HPI  Patient states that he bent over today while at work.  He states that he has to lean over to cut the plastic off of a pallet, he works as a Estate agent.  He bent over with a knife in his hand and as he leaned forward to cut the plastic he felt sudden pain in his back.  The pain is in his left low back.  He was reaching for with his right hand.  There is no accident, injury, slip, or fall.  He was doing his usual work activities He does not usually have back problems He is here with back pain, limited movement.  Pain that goes from the left low back into his buttock and the back of his leg.  No numbness or weakness.  No bowel or bladder complaints.  Past Medical History:  Diagnosis Date   Hypertension     There are no problems to display for this patient.   Past Surgical History:  Procedure Laterality Date   NASAL SINUS SURGERY     NOSE SURGERY     benign tumor       Home Medications    Prior to Admission medications   Medication Sig Start Date End Date Taking? Authorizing Provider  amLODipine (NORVASC) 5 MG tablet Take 5 mg by mouth daily. 12/01/19   [provider]  diclofenac (VOLTAREN) 75 MG EC tablet Take 75 mg by mouth 2 (two) times daily. 11/17/19   [provider]  HYDROcodone-acetaminophen (NORCO) 7.5-325 MG tablet Take 1 tablet by mouth every 6 (six) hours as needed for moderate pain. 12/17/19   Eustace Moore, MD  tiZANidine (ZANAFLEX) 4 MG tablet Take 1-2 tablets (4-8 mg total) by mouth every 6 (six) hours as needed for muscle spasms. 12/17/19   Eustace Moore, MD  albuterol (VENTOLIN HFA) 108 (90 Base) MCG/ACT inhaler Inhale 1-2 puffs into the lungs every 6 (six) hours as needed for wheezing or shortness of breath.  04/11/19 07/21/19  Domenick Gong, MD  fluticasone (FLONASE) 50 MCG/ACT nasal spray Place 2 sprays into both nostrils daily. 04/11/19 07/21/19  Domenick Gong, MD    Family History Family History  Problem Relation Age of Onset   Hypertension Mother    Diabetes Father     Social History Social History   Tobacco Use   Smoking status: Current Every Day Smoker   Smokeless tobacco: Never Used  Building services engineer Use: Never used  Substance Use Topics   Alcohol use: Yes   Drug use: No     Allergies   Amoxicillin   Review of Systems Review of Systems See HPI  Physical Exam Triage Vital Signs ED Triage Vitals  Enc Vitals Group     BP 12/17/19 1804 (!) 145/68     Pulse Rate 12/17/19 1804 94     Resp 12/17/19 1804 18     Temp 12/17/19 1804 98.7 F (37.1 C)     Temp Source 12/17/19 1804 Oral     SpO2 --      Weight 12/17/19 1803 (!) 360 lb (163.3 kg)     Height 12/17/19 1803 6\' 3"  (1.905 m)     Head Circumference --  Peak Flow --      Pain Score 12/17/19 1802 9     Pain Loc --      Pain Edu? --      Excl. in GC? --    No data found.  Updated Vital Signs BP (!) 145/68 (BP Location: Left Arm)    Pulse 94    Temp 98.7 F (37.1 C) (Oral)    Resp 18    Ht 6\' 3"  (1.905 m)    Wt (!) 163.3 kg    BMI 45.00 kg/m     Physical Exam Constitutional:      General: He is not in acute distress.    Appearance: He is well-developed. He is obese.     Comments: Is laying flat on his back on the bed as I enter.  Moderately uncomfortable  HENT:     Head: Normocephalic and atraumatic.     Mouth/Throat:     Comments: Mask is in place Eyes:     Conjunctiva/sclera: Conjunctivae normal.     Pupils: Pupils are equal, round, and reactive to light.  Cardiovascular:     Rate and Rhythm: Normal rate.  Pulmonary:     Effort: Pulmonary effort is normal. No respiratory distress.  Musculoskeletal:        General: Normal range of motion.     Cervical back: Normal range of  motion.     Comments: Tenderness in the left lumbar column of muscles and left SI region.  Slow but full range of motion.  Strength sensation range of motion reflexes are normal in both lower extremities with no neurologic findings  Skin:    General: Skin is warm and dry.  Neurological:     General: No focal deficit present.     Mental Status: He is alert.  Psychiatric:        Mood and Affect: Mood normal.        Behavior: Behavior normal.    Morbid obesity Left lumbar tenderness and spasm  UC Treatments / Results  Labs (all labs ordered are listed, but only abnormal results are displayed) Labs Reviewed - No data to display  EKG   Radiology No results found.  Procedures Procedures (including critical care time)  Medications Ordered in UC Medications - No data to display  Initial Impression / Assessment and Plan / UC Course  I have reviewed the triage vital signs and the nursing notes.  Pertinent labs & imaging results that were available during my care of the patient were reviewed by me and considered in my medical decision making (see chart for details).     Mechanical back pain.  Discussed that imaging is not indicated.  Conservative management instituted Final Clinical Impressions(s) / UC Diagnoses   Final diagnoses:  Strain of lumbar region, initial encounter     Discharge Instructions     Continue the diclofenac 2 x a day with food This is an anti inflammatory pain medicine Add tizanidine as needed muscle relaxer- this is useful at night Take the hydrocodone as needed pain. do not drive on the hydrocodone Ice, then heat to back Call if not improving by Monday.    ED Prescriptions    Medication Sig Dispense Auth. Provider   tiZANidine (ZANAFLEX) 4 MG tablet Take 1-2 tablets (4-8 mg total) by mouth every 6 (six) hours as needed for muscle spasms. 21 tablet Sunday, MD   HYDROcodone-acetaminophen [facility]) 7.5-325 MG tablet Take 1 tablet by  mouth every 6 (  six) hours as needed for moderate pain. 15 tablet Eustace Moore, MD     I have reviewed the PDMP during this encounter.   Eustace Moore, MD 12/19/19 1135

## 2020-03-18 ENCOUNTER — Ambulatory Visit (HOSPITAL_COMMUNITY)
Admission: EM | Admit: 2020-03-18 | Discharge: 2020-03-18 | Disposition: A | Discharge status: Home / Self Care | Payer: Managed Care, Other (non HMO) | Attending: Physician Assistant | Admitting: Physician Assistant

## 2020-03-18 ENCOUNTER — Other Ambulatory Visit: Payer: Self-pay

## 2020-03-18 ENCOUNTER — Encounter (HOSPITAL_COMMUNITY): Payer: Self-pay

## 2020-03-18 DIAGNOSIS — I10 Essential (primary) hypertension: Secondary | ICD-10-CM | POA: Diagnosis not present

## 2020-03-18 DIAGNOSIS — R5383 Other fatigue: Secondary | ICD-10-CM | POA: Diagnosis present

## 2020-03-18 DIAGNOSIS — R509 Fever, unspecified: Secondary | ICD-10-CM | POA: Insufficient documentation

## 2020-03-18 DIAGNOSIS — Z20822 Contact with and (suspected) exposure to covid-19: Secondary | ICD-10-CM | POA: Insufficient documentation

## 2020-03-18 LAB — SARS CORONAVIRUS 2 (TAT 6-24 HRS): SARS Coronavirus 2: POSITIVE — AB

## 2020-03-18 NOTE — Discharge Instructions (Signed)
Go to the emergency room if you have any worsening symptoms Follow up with PCP in 2 - 3 weeks for management of blood pressure Take tylenol as needed for fever, body aches. Remain in quarantine, at least 10 days per [facility] guidelines

## 2020-03-18 NOTE — ED Provider Notes (Signed)
[facility]    CSN: [id number] Arrival date & time: 03/18/20  1214      History   Chief Complaint Chief Complaint  Patient presents with  . Fever  . Chills  . Weakness    HPI Wesley Wheeler is a 29 y.o. male.   Patient here concerned with fever x today.  Admits fatigue, denies nasal congestion, rhinorrhea, sore throat, n/v/d, abdominal pain, wheezing, SOB.  No advil or tylenol today.  No known exposures to cOVID, no sick contacts.  He has not had COVID vaccine.     Past Medical History:  Diagnosis Date  . Hypertension     There are no problems to display for this patient.   Past Surgical History:  Procedure Laterality Date  . NASAL SINUS SURGERY    . NOSE SURGERY     benign tumor       Home Medications    Prior to Admission medications   Medication Sig Start Date End Date Taking? Authorizing Provider  amLODipine (NORVASC) 5 MG tablet Take 5 mg by mouth daily. 12/01/19   [provider]  diclofenac (VOLTAREN) 75 MG EC tablet Take 75 mg by mouth 2 (two) times daily. 11/17/19   [provider]  HYDROcodone-acetaminophen (NORCO) 7.5-325 MG tablet Take 1 tablet by mouth every 6 (six) hours as needed for moderate pain. 12/17/19   Eustace Moore, MD  tiZANidine (ZANAFLEX) 4 MG tablet Take 1-2 tablets (4-8 mg total) by mouth every 6 (six) hours as needed for muscle spasms. 12/17/19   Eustace Moore, MD  albuterol (VENTOLIN HFA) 108 (90 Base) MCG/ACT inhaler Inhale 1-2 puffs into the lungs every 6 (six) hours as needed for wheezing or shortness of breath. 04/11/19 07/21/19  Domenick Gong, MD  fluticasone (FLONASE) 50 MCG/ACT nasal spray Place 2 sprays into both nostrils daily. 04/11/19 07/21/19  Domenick Gong, MD    Family History Family History  Problem Relation Age of Onset  . Hypertension Mother   . Diabetes Father     Social History Social History   Tobacco Use  . Smoking status: Current Every Day Smoker  . Smokeless  tobacco: Never Used  Vaping Use  . Vaping Use: Never used  Substance Use Topics  . Alcohol use: Yes  . Drug use: No     Allergies   Amoxicillin   Review of Systems Review of Systems  Constitutional: Positive for chills, fatigue and fever.  HENT: Negative for congestion, ear pain, nosebleeds, postnasal drip, rhinorrhea, sinus pressure, sinus pain and sore throat.   Eyes: Negative for pain and redness.  Respiratory: Negative for cough, shortness of breath and wheezing.   Gastrointestinal: Negative for abdominal pain, diarrhea, nausea and vomiting.  Musculoskeletal: Negative for arthralgias and myalgias.  Skin: Negative for rash.  Neurological: Negative for light-headedness and headaches.  Hematological: Negative for adenopathy. Does not bruise/bleed easily.  Psychiatric/Behavioral: Negative for confusion and sleep disturbance.     Physical Exam Triage Vital Signs ED Triage Vitals  Enc Vitals Group     BP 03/18/20 1450 (!) 182/118     Pulse Rate 03/18/20 1450 100     Resp 03/18/20 1450 (!) 22     Temp 03/18/20 1450 (!) 102.9 F (39.4 C)     Temp Source 03/18/20 1450 Oral     SpO2 03/18/20 1450 99 %     Weight --      Height --      Head Circumference --  Peak Flow --      Pain Score 03/18/20 1452 3     Pain Loc --      Pain Edu? --      Excl. in GC? --    No data found.  Updated Vital Signs BP (!) 186/125 (BP Location: Right Arm)   Pulse 100   Temp (!) 102.9 F (39.4 C) (Oral)   Resp (!) 22   SpO2 99%   Visual Acuity Right Eye Distance:   Left Eye Distance:   Bilateral Distance:    Right Eye Near:   Left Eye Near:    Bilateral Near:     Physical Exam Vitals and nursing note reviewed.  Constitutional:      General: He is not in acute distress.    Appearance: Normal appearance. He is not ill-appearing.  HENT:     Head: Normocephalic and atraumatic.     Right Ear: Tympanic membrane and ear canal normal.     Left Ear: Tympanic membrane and ear  canal normal.     Nose: No congestion or rhinorrhea.     Mouth/Throat:     Pharynx: No oropharyngeal exudate or posterior oropharyngeal erythema.  Eyes:     General: No scleral icterus.    Extraocular Movements: Extraocular movements intact.     Conjunctiva/sclera: Conjunctivae normal.  Cardiovascular:     Rate and Rhythm: Normal rate and regular rhythm.     Heart sounds: No murmur heard.   Pulmonary:     Effort: Pulmonary effort is normal. No respiratory distress.     Breath sounds: Normal breath sounds. No wheezing or rales.  Musculoskeletal:     Cervical back: Normal range of motion. No rigidity.  Skin:    Capillary Refill: Capillary refill takes less than 2 seconds.     Coloration: Skin is not jaundiced.     Findings: No rash.  Neurological:     General: No focal deficit present.     Mental Status: He is alert and oriented to person, place, and time.     Motor: No weakness.     Gait: Gait normal.  Psychiatric:        Mood and Affect: Mood normal.        Behavior: Behavior normal.      UC Treatments / Results  Labs (all labs ordered are listed, but only abnormal results are displayed) Labs Reviewed  SARS CORONAVIRUS 2 (TAT 6-24 HRS)    EKG   Radiology No results found.  Procedures Procedures (including critical care time)  Medications Ordered in UC Medications - No data to display  Initial Impression / Assessment and Plan / UC Course  I have reviewed the triage vital signs and the nursing notes.  Pertinent labs & imaging results that were available during my care of the patient were reviewed by me and considered in my medical decision making (see chart for details).     ED precautions provided Follow up with PCP in 2 - 3 weeks for management of HTN Take tylenol as needed for pain Remain in quarantine per CDC guidelines pending COVID test results.  Final Clinical Impressions(s) / UC Diagnoses   Final diagnoses:  Fever, unspecified  Fatigue,  unspecified type  Essential hypertension, benign  Encounter for laboratory testing for COVID-19 virus     Discharge Instructions     Go to the emergency room if you have any worsening symptoms Follow up with PCP in 2 - 3 weeks for management of  blood pressure Take tylenol as needed for fever, body aches. Remain in quarantine, at least 10 days per [facility] guidelines    ED Prescriptions    None     PDMP not reviewed this encounter.   Evern Core, PA-C 03/18/20 1540

## 2020-03-18 NOTE — ED Triage Notes (Signed)
Pt presents with fever, chills, and weakness since waking up this morning.

## 2020-03-19 ENCOUNTER — Telehealth: Payer: Self-pay | Admitting: Physician Assistant

## 2020-03-19 NOTE — Telephone Encounter (Signed)
  Called to discuss with patient about Covid symptoms and the use of casirivimab/imdevimab, a monoclonal antibody infusion for those with mild to moderate Covid symptoms and at a high risk of hospitalization.  Pt is qualified for this infusion at the [facility] due  Hx of HTN.   Message left to call back our hotline [phone] and send my chart message.   Manson Passey, PA - C

## 2020-03-19 NOTE — Telephone Encounter (Signed)
Second attempt to reach for MAB - forwarded to VM immediately    Rexene Alberts, MSN, NP-C [facility]  [email or web address] Pager: [phone] Office: [phone] [facility] Main Line: [phone]

## 2020-03-23 ENCOUNTER — Telehealth: Payer: Self-pay | Admitting: *Deleted

## 2020-03-23 NOTE — Telephone Encounter (Signed)
Pt calling with questions regarding cleaning  of home. He tested positive for covid, wife negative. Advised to pt's satisfaction. Verbalizes understanding.  Advised to [facility] for any additional questions.

## 2020-07-16 ENCOUNTER — Encounter (HOSPITAL_COMMUNITY): Payer: Self-pay

## 2020-07-16 ENCOUNTER — Other Ambulatory Visit: Payer: Self-pay

## 2020-07-16 ENCOUNTER — Ambulatory Visit (HOSPITAL_COMMUNITY)
Admission: EM | Admit: 2020-07-16 | Discharge: 2020-07-16 | Disposition: A | Discharge status: Home / Self Care | Payer: Managed Care, Other (non HMO)

## 2020-07-16 ENCOUNTER — Ambulatory Visit (INDEPENDENT_AMBULATORY_CARE_PROVIDER_SITE_OTHER): Payer: Managed Care, Other (non HMO)

## 2020-07-16 DIAGNOSIS — M542 Cervicalgia: Secondary | ICD-10-CM | POA: Diagnosis not present

## 2020-07-16 DIAGNOSIS — M5412 Radiculopathy, cervical region: Secondary | ICD-10-CM | POA: Diagnosis not present

## 2020-07-16 DIAGNOSIS — S161XXA Strain of muscle, fascia and tendon at neck level, initial encounter: Secondary | ICD-10-CM | POA: Diagnosis not present

## 2020-07-16 DIAGNOSIS — R200 Anesthesia of skin: Secondary | ICD-10-CM | POA: Diagnosis not present

## 2020-07-16 DIAGNOSIS — M5410 Radiculopathy, site unspecified: Secondary | ICD-10-CM

## 2020-07-16 MED ORDER — CYCLOBENZAPRINE HCL 10 MG PO TABS: 10.0000 mg | ORAL_TABLET | Freq: Two times a day (BID) | ORAL | 0 refills | Status: DC | PRN

## 2020-07-16 MED ORDER — PREDNISONE 20 MG PO TABS: 40.0000 mg | ORAL_TABLET | Freq: Every day | ORAL | 0 refills | Status: DC

## 2020-07-16 NOTE — ED Triage Notes (Signed)
Pt presents with neck pain, radiates to right shoulder and right arm; right hand numbness x 1 week. Worsens when moving beck to the right side. Denies dizziness, headache, vision changes, chest pain.

## 2020-07-16 NOTE — ED Provider Notes (Signed)
[facility]    CSN: [id number] Arrival date & time: 07/16/20  1036      History   Chief Complaint Chief Complaint  Patient presents with  . hand numbness  . Neck Pain         HPI Wesley Wheeler is a 30 y.o. male.   Here today with 1 week history of neck pain worst on the right side radiating down right arm causing some numbness in 4th and 5th finger. Pain worsens when moving the neck around, particularly stretching the right side out. Denies dizziness, headache, sharp midline neck pain, injury to area, visual changes, CP, SOB. 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 Not trying anything OTC for sxs.     Past Medical History:  Diagnosis Date  . Hypertension     There are no problems to display for this patient.   Past Surgical History:  Procedure Laterality Date  . NASAL SINUS SURGERY    . NOSE SURGERY     benign tumor       Home Medications    Prior to Admission medications   Medication Sig Start Date End Date Taking? Authorizing Provider  cyclobenzaprine (FLEXERIL) 10 MG tablet Take 1 tablet (10 mg total) by mouth 2 (two) times daily as needed for muscle spasms. 07/16/20  Yes Particia Nearing, PA-C  Multiple Vitamin (MULTIVITAMIN) tablet Take 1 tablet by mouth daily.   Yes [provider]  predniSONE (DELTASONE) 20 MG tablet Take 2 tablets (40 mg total) by mouth daily with breakfast. DO NOT DRINK ALCOHOL OR DRIVE WHILE TAKING THIS MEDICATION 07/16/20  Yes Particia Nearing, PA-C  amLODipine (NORVASC) 5 MG tablet Take 5 mg by mouth daily. 12/01/19   [provider]  diclofenac (VOLTAREN) 75 MG EC tablet Take 75 mg by mouth 2 (two) times daily. 11/17/19   [provider]  HYDROcodone-acetaminophen (NORCO) 7.5-325 MG tablet Take 1 tablet by mouth every 6 (six) hours as needed for moderate pain. 12/17/19   Eustace Moore, MD  albuterol (VENTOLIN HFA) 108 (90 Base) MCG/ACT  inhaler Inhale 1-2 puffs into the lungs every 6 (six) hours as needed for wheezing or shortness of breath. 04/11/19 07/21/19  Domenick Gong, MD  fluticasone (FLONASE) 50 MCG/ACT nasal spray Place 2 sprays into both nostrils daily. 04/11/19 07/21/19  Domenick Gong, MD    Family History Family History  Problem Relation Age of Onset  . Hypertension Mother   . Diabetes Father     Social History Social History   Tobacco Use  . Smoking status: Current Every Day Smoker  . Smokeless tobacco: Never Used  Vaping Use  . Vaping Use: Never used  Substance Use Topics  . Alcohol use: Yes  . Drug use: No     Allergies   Amoxicillin   Review of Systems Review of Systems PER HPI    Physical Exam Triage Vital Signs ED Triage Vitals  Enc Vitals Group     BP 07/16/20 1119 (!) 168/130     Pulse Rate 07/16/20 1119 86     Resp 07/16/20 1119 (!) 21     Temp 07/16/20 1119 98.5 F (36.9 C)     Temp Source 07/16/20 1119 Oral     SpO2 07/16/20 1119 95 %     Weight --      Height --      Head Circumference --      Peak Flow --  Pain Score 07/16/20 1116 4     Pain Loc --      Pain Edu? --      Excl. in GC? --    No data found.  Updated Vital Signs BP (!) 168/130 (BP Location: Right Wrist)   Pulse 86   Temp 98.5 F (36.9 C) (Oral)   Resp (!) 21   SpO2 95%   Visual Acuity Right Eye Distance:   Left Eye Distance:   Bilateral Distance:    Right Eye Near:   Left Eye Near:    Bilateral Near:     Physical Exam Vitals and nursing note reviewed.  Constitutional:      Appearance: Normal appearance.  HENT:     Head: Atraumatic.  Eyes:     Extraocular Movements: Extraocular movements intact.     Conjunctiva/sclera: Conjunctivae normal.  Cardiovascular:     Rate and Rhythm: Normal rate and regular rhythm.     Pulses: Normal pulses.  Pulmonary:     Effort: Pulmonary effort is normal.     Breath sounds: Normal breath sounds.  Musculoskeletal:        General: No  swelling or deformity. Normal range of motion.     Cervical back: Normal range of motion and neck supple. Tenderness (mild right SCM and trapezius ttp and spasm) present.     Comments: No midline spinal ttp diffusely  Skin:    General: Skin is warm and dry.  Neurological:     General: No focal deficit present.     Mental Status: He is oriented to person, place, and time.     Sensory: No sensory deficit.     Motor: No weakness.     Gait: Gait normal.  Psychiatric:        Mood and Affect: Mood normal.        Thought Content: Thought content normal.        Judgment: Judgment normal.      UC Treatments / Results  Labs (all labs ordered are listed, but only abnormal results are displayed) Labs Reviewed - No data to display  EKG   Radiology DG Cervical Spine Complete  Result Date: 07/16/2020 CLINICAL DATA:  RIGHT neck pain with radicular pain and numbness down RIGHT arm for 1 week. EXAM: CERVICAL SPINE - COMPLETE 4+ VIEW COMPARISON:  None. FINDINGS: Mild dextroscoliosis which may be accentuated by patient positioning. Slight reversal of the normal cervical spine lordosis. No evidence of acute vertebral body subluxation. No fracture line or displaced fracture fragment. No acute-appearing cortical irregularity or osseous lesion. No appreciable degenerative change. Prevertebral soft tissues appear normal in thickness. IMPRESSION: 1. No acute findings. 2. Mild scoliosis and reversal of the normal cervical spine lordosis, which may be related to patient positioning or muscle spasm. 3. No degenerative change seen. Electronically Signed   By: Bary Richard M.D.   On: 07/16/2020 12:51    Procedures Procedures (including critical care time)  Medications Ordered in UC Medications - No data to display  Initial Impression / Assessment and Plan / UC Course  I have reviewed the triage vital signs and the nursing notes.  Pertinent labs & imaging results that were available during my care of the  patient were reviewed by me and considered in my medical decision making (see chart for details).     X-ray today showing mild cervical scoliosis vs muscle spasm, suspect the latter given his history and presentation. Treat with prednisone burst, flexeril, massage, heat, rest. Work  note given. PCP f/u for recheck especially if not resolving.   Final Clinical Impressions(s) / UC Diagnoses   Final diagnoses:  Acute strain of neck muscle, initial encounter  Cervical radiculopathy   Discharge Instructions   None    ED Prescriptions    Medication Sig Dispense Auth. Provider   predniSONE (DELTASONE) 20 MG tablet Take 2 tablets (40 mg total) by mouth daily with breakfast. DO NOT DRINK ALCOHOL OR DRIVE WHILE TAKING THIS MEDICATION 10 tablet Particia Nearing, PA-C   cyclobenzaprine (FLEXERIL) 10 MG tablet Take 1 tablet (10 mg total) by mouth 2 (two) times daily as needed for muscle spasms. 20 tablet Particia Nearing, [place]     PDMP not reviewed this encounter.   Particia Nearing, [place] 07/16/20 1325

## 2020-07-16 NOTE — ED Notes (Signed)
Blood pressure reported to [place], [place].

## 2020-07-22 ENCOUNTER — Ambulatory Visit (HOSPITAL_COMMUNITY)
Admission: EM | Admit: 2020-07-22 | Discharge: 2020-07-22 | Disposition: A | Discharge status: Home / Self Care | Payer: Managed Care, Other (non HMO) | Attending: Family Medicine | Admitting: Family Medicine

## 2020-07-22 ENCOUNTER — Encounter (HOSPITAL_COMMUNITY): Payer: Self-pay

## 2020-07-22 DIAGNOSIS — M25511 Pain in right shoulder: Secondary | ICD-10-CM

## 2020-07-22 MED ORDER — PREDNISONE 20 MG PO TABS: 40.0000 mg | ORAL_TABLET | Freq: Every day | ORAL | 0 refills | Status: DC

## 2020-07-22 MED ORDER — CYCLOBENZAPRINE HCL 10 MG PO TABS: 10.0000 mg | ORAL_TABLET | Freq: Two times a day (BID) | ORAL | 0 refills | Status: DC | PRN

## 2020-07-22 NOTE — ED Provider Notes (Signed)
[facility]    CSN: [id number] Arrival date & time: 07/22/20  1041      History   Chief Complaint Chief Complaint  Patient presents with  . Shoulder Pain    HPI Wesley Wheeler is a 30 y.o. male.   Here today with right sided shoulder pain that started a few weeks ago and felt like muscle pain/spasm coming from right side of neck. Was seen almost a week ago, given prednisone and flexeril which did nearly resolve sxs. Cervical x-ray at the time showing probable muscle spasm given alignment/curvature but no acute bony abnormality. He states he went back to work and today hit his shoulder on a pallet and caused the pain to flare up again. Denies radiation of pain down arm, swelling, numbness, tingling, decreased ROM. Not trying anything since event this morning.      Past Medical History:  Diagnosis Date  . Hypertension     There are no problems to display for this patient.   Past Surgical History:  Procedure Laterality Date  . NASAL SINUS SURGERY    . NOSE SURGERY     benign tumor       Home Medications    Prior to Admission medications   Medication Sig Start Date End Date Taking? Authorizing Provider  amLODipine (NORVASC) 5 MG tablet Take 5 mg by mouth daily. 12/01/19   [provider]  cyclobenzaprine (FLEXERIL) 10 MG tablet Take 1 tablet (10 mg total) by mouth 2 (two) times daily as needed for muscle spasms. 07/22/20   Particia Nearing, PA-C  diclofenac (VOLTAREN) 75 MG EC tablet Take 75 mg by mouth 2 (two) times daily. 11/17/19   [provider]  HYDROcodone-acetaminophen (NORCO) 7.5-325 MG tablet Take 1 tablet by mouth every 6 (six) hours as needed for moderate pain. 12/17/19   Eustace Moore, MD  Multiple Vitamin (MULTIVITAMIN) tablet Take 1 tablet by mouth daily.    [provider]  predniSONE (DELTASONE) 20 MG tablet Take 2 tablets (40 mg total) by mouth daily with breakfast. DO NOT DRINK ALCOHOL OR DRIVE WHILE TAKING  THIS MEDICATION 07/22/20   Particia Nearing, PA-C  albuterol (VENTOLIN HFA) 108 (90 Base) MCG/ACT inhaler Inhale 1-2 puffs into the lungs every 6 (six) hours as needed for wheezing or shortness of breath. 04/11/19 07/21/19  Domenick Gong, MD  fluticasone (FLONASE) 50 MCG/ACT nasal spray Place 2 sprays into both nostrils daily. 04/11/19 07/21/19  Domenick Gong, MD    Family History Family History  Problem Relation Age of Onset  . Hypertension Mother   . Diabetes Father     Social History Social History   Tobacco Use  . Smoking status: Current Every Day Smoker  . Smokeless tobacco: Never Used  Vaping Use  . Vaping Use: Never used  Substance Use Topics  . Alcohol use: Yes  . Drug use: No     Allergies   Amoxicillin   Review of Systems Review of Systems PER HPI   Physical Exam Triage Vital Signs ED Triage Vitals  Enc Vitals Group     BP 07/22/20 1144 (!) 172/114     Pulse Rate 07/22/20 1144 86     Resp 07/22/20 1144 20     Temp 07/22/20 1144 98.2 F (36.8 C)     Temp src --      SpO2 07/22/20 1144 96 %     Weight --      Height --  Head Circumference --      Peak Flow --      Pain Score 07/22/20 1143 6     Pain Loc --      Pain Edu? --      Excl. in GC? --    No data found.  Updated Vital Signs BP (!) 172/114   Pulse 86   Temp 98.2 F (36.8 C)   Resp 20   SpO2 96%   Visual Acuity Right Eye Distance:   Left Eye Distance:   Bilateral Distance:    Right Eye Near:   Left Eye Near:    Bilateral Near:     Physical Exam Vitals and nursing note reviewed.  Constitutional:      Appearance: Normal appearance.  HENT:     Head: Atraumatic.  Eyes:     Extraocular Movements: Extraocular movements intact.     Conjunctiva/sclera: Conjunctivae normal.  Cardiovascular:     Rate and Rhythm: Normal rate and regular rhythm.     Pulses: Normal pulses.  Pulmonary:     Effort: Pulmonary effort is normal.     Breath sounds: Normal breath sounds.   Musculoskeletal:        General: Tenderness (deltoid ttp right side) and signs of injury present. No swelling or deformity. Normal range of motion.     Cervical back: Normal range of motion and neck supple.     Comments: Good grip strength b/l UEs  Skin:    General: Skin is warm and dry.     Findings: No bruising or erythema.  Neurological:     General: No focal deficit present.     Mental Status: He is oriented to person, place, and time.     Sensory: No sensory deficit.  Psychiatric:        Mood and Affect: Mood normal.        Thought Content: Thought content normal.        Judgment: Judgment normal.     UC Treatments / Results  Labs (all labs ordered are listed, but only abnormal results are displayed) Labs Reviewed - No data to display  EKG   Radiology No results found.  Procedures Procedures (including critical care time)  Medications Ordered in UC Medications - No data to display  Initial Impression / Assessment and Plan / UC Course  I have reviewed the triage vital signs and the nursing notes.  Pertinent labs & imaging results that were available during my care of the patient were reviewed by me and considered in my medical decision making (see chart for details).     Suspect returning pain from contusion. No evidence of bony or nerve injury. Will treat with one more round of steroids and muscle relaxers, give work note for rest, stretch, heat, and Sports Medicine f/u if not fully resolving.   Final Clinical Impressions(s) / UC Diagnoses   Final diagnoses:  Acute pain of right shoulder   Discharge Instructions   None    ED Prescriptions    Medication Sig Dispense Auth. Provider   predniSONE (DELTASONE) 20 MG tablet Take 2 tablets (40 mg total) by mouth daily with breakfast. DO NOT DRINK ALCOHOL OR DRIVE WHILE TAKING THIS MEDICATION 10 tablet Particia Nearing, PA-C   cyclobenzaprine (FLEXERIL) 10 MG tablet Take 1 tablet (10 mg total) by mouth 2  (two) times daily as needed for muscle spasms. 20 tablet Particia Nearing, [place]     PDMP not reviewed this encounter.  Particia Nearing, [place] 07/22/20 1230

## 2020-07-22 NOTE — ED Triage Notes (Signed)
Pt in with c/o right shoulder pain that occurred today while he was at work.  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

## 2020-09-02 ENCOUNTER — Ambulatory Visit (HOSPITAL_COMMUNITY)
Admission: EM | Admit: 2020-09-02 | Discharge: 2020-09-02 | Disposition: A | Discharge status: Home / Self Care | Payer: Managed Care, Other (non HMO) | Attending: Urgent Care | Admitting: Urgent Care

## 2020-09-02 ENCOUNTER — Other Ambulatory Visit: Payer: Self-pay

## 2020-09-02 ENCOUNTER — Encounter (HOSPITAL_COMMUNITY): Payer: Self-pay | Admitting: *Deleted

## 2020-09-02 DIAGNOSIS — I10 Essential (primary) hypertension: Secondary | ICD-10-CM

## 2020-09-02 DIAGNOSIS — R030 Elevated blood-pressure reading, without diagnosis of hypertension: Secondary | ICD-10-CM

## 2020-09-02 DIAGNOSIS — K529 Noninfective gastroenteritis and colitis, unspecified: Secondary | ICD-10-CM | POA: Diagnosis not present

## 2020-09-02 DIAGNOSIS — R197 Diarrhea, unspecified: Secondary | ICD-10-CM

## 2020-09-02 MED ORDER — LOPERAMIDE HCL 2 MG PO CAPS: 2.0000 mg | ORAL_CAPSULE | Freq: Two times a day (BID) | ORAL | 0 refills | Status: DC | PRN

## 2020-09-02 MED ORDER — LOSARTAN POTASSIUM 50 MG PO TABS: 50.0000 mg | ORAL_TABLET | Freq: Every day | ORAL | 0 refills | Status: DC

## 2020-09-02 MED ORDER — ONDANSETRON 8 MG PO TBDP: 8.0000 mg | ORAL_TABLET | Freq: Three times a day (TID) | ORAL | 0 refills | Status: DC | PRN

## 2020-09-02 NOTE — ED Triage Notes (Signed)
Pt reports he had two episodes of Diarrhea this AM and ABD pain .

## 2020-09-02 NOTE — ED Provider Notes (Signed)
[facility]   MRN: [id number] DOB: July 22, 1990  Subjective:   Wesley Wheeler is a 30 y.o. male presenting for acute onset this morning of persistent diarrhea, lower abdominal pain.  Denies fever, cough, chest pain, shortness of breath, nausea, vomiting, bloody stools, recent hospitalizations, recent long distance travel.  Patient did have a close contact with very similar symptoms, his wife was sick with GI illness and is not better.  Has not tried medications for relief.  Regarding his blood pressure, patient states that he was recently put on something but ran out of medication.  He did not seek a refill or seek to establish care with anyone in particular.  Denies headache, confusion, vision change, weakness, numbness or tingling.  No current facility-administered medications for this encounter.  Current Outpatient Medications:  .  amLODipine (NORVASC) 5 MG tablet, Take 5 mg by mouth daily., Disp: , Rfl:  .  cyclobenzaprine (FLEXERIL) 10 MG tablet, Take 1 tablet (10 mg total) by mouth 2 (two) times daily as needed for muscle spasms., Disp: 20 tablet, Rfl: 0 .  diclofenac (VOLTAREN) 75 MG EC tablet, Take 75 mg by mouth 2 (two) times daily., Disp: , Rfl:  .  HYDROcodone-acetaminophen (NORCO) 7.5-325 MG tablet, Take 1 tablet by mouth every 6 (six) hours as needed for moderate pain., Disp: 15 tablet, Rfl: 0 .  Multiple Vitamin (MULTIVITAMIN) tablet, Take 1 tablet by mouth daily., Disp: , Rfl:  .  predniSONE (DELTASONE) 20 MG tablet, Take 2 tablets (40 mg total) by mouth daily with breakfast. DO NOT DRINK ALCOHOL OR DRIVE WHILE TAKING THIS MEDICATION, Disp: 10 tablet, Rfl: 0   Allergies  Allergen Reactions  . Amoxicillin     Past Medical History:  Diagnosis Date  . Hypertension      Past Surgical History:  Procedure Laterality Date  . NASAL SINUS SURGERY    . NOSE SURGERY     benign tumor    Family History  Problem Relation Age of Onset  . Hypertension Mother    . Diabetes Father     Social History   Tobacco Use  . Smoking status: Current Every Day Smoker  . Smokeless tobacco: Never Used  Vaping Use  . Vaping Use: Never used  Substance Use Topics  . Alcohol use: Yes  . Drug use: No    ROS   Objective:   Vitals: BP (!) 159/117 (BP Location: Right Arm)   Pulse 93   Temp 98.3 F (36.8 C) (Oral)   Resp 18   BP Readings from Last 3 Encounters:  09/02/20 (!) 159/117  07/22/20 (!) 172/114  07/16/20 (!) 168/130   Physical Exam Constitutional:      General: He is not in acute distress.    Appearance: Normal appearance. He is well-developed. He is not ill-appearing, toxic-appearing or diaphoretic.  HENT:     Head: Normocephalic and atraumatic.     Right Ear: External ear normal.     Left Ear: External ear normal.     Nose: Nose normal.     Mouth/Throat:     Mouth: Mucous membranes are moist.     Pharynx: Oropharynx is clear.  Eyes:     General: No scleral icterus.    Extraocular Movements: Extraocular movements intact.     Pupils: Pupils are equal, round, and reactive to light.  Cardiovascular:     Rate and Rhythm: Normal rate and regular rhythm.     Heart sounds: Normal heart sounds.  No murmur heard. No friction rub. No gallop.   Pulmonary:     Effort: Pulmonary effort is normal. No respiratory distress.     Breath sounds: Normal breath sounds. No stridor. No wheezing, rhonchi or rales.  Abdominal:     General: Bowel sounds are increased. There is no distension.     Palpations: Abdomen is soft. There is no mass.     Tenderness: There is no abdominal tenderness. There is no guarding or rebound.  Skin:    General: Skin is warm and dry.  Neurological:     Mental Status: He is alert and oriented to person, place, and time.     Cranial Nerves: No cranial nerve deficit.     Motor: No weakness.     Coordination: Coordination normal.     Gait: Gait normal.     Deep Tendon Reflexes: Reflexes normal.  Psychiatric:         Mood and Affect: Mood normal.        Behavior: Behavior normal.        Thought Content: Thought content normal.      Assessment and Plan :   PDMP not reviewed this encounter.  1. Colitis   2. Diarrhea, unspecified type   3. Essential hypertension   4. Elevated blood pressure reading     Will manage for suspected viral colitis with supportive care.  Recommended patient hydrate well, eat light meals and maintain electrolytes.  Will use Zofran and Imodium for nausea, vomiting and diarrhea.  Regarding his blood pressure, emphasized need for dietary modifications, start losartan.  Establish care with new PCP, placed him into the PCP assistance.  Counseled patient on potential for adverse effects with medications prescribed/recommended today, ER and return-to-clinic precautions discussed, patient verbalized understanding.    Wallis Bamberg, PA-C 09/02/20 1203

## 2020-09-02 NOTE — Discharge Instructions (Signed)
Make sure you push fluids drinking mostly water but mix it with Gatorade.  Try to eat light meals including soups, broths and soft foods, fruits.  You may use Zofran for your nausea and vomiting once every 8 hours.  Imodium can help with diarrhea but use this carefully limiting it to 1-2 times per day only if you are having a lot of diarrhea.  Please return to the clinic if symptoms worsen or you start having severe abdominal pain not helped by taking Tylenol or start having bloody stools or blood in the vomit. ° ° °For diabetes or elevated blood sugar, please make sure you are limiting and avoiding starchy, carbohydrate foods like pasta, breads, sweet breads, pastry, rice, potatoes, desserts. These foods can elevate your blood sugar. Also, limit and avoid drinks that contain a lot of sugar such as sodas, sweet teas, fruit juices.  Drinking plain water will be much more helpful, try 64 ounces of water daily.  It is okay to flavor your water naturally by cutting cucumber, lemon, mint or lime, placing it in a picture with water and drinking it over a period of 24-48 hours as long as it remains refrigerated. ° °For elevated blood pressure, make sure you are monitoring salt in your diet.  Do not eat restaurant foods and limit processed foods at home. I highly recommend you prepare and cook your own foods at home.  Processed foods include things like frozen meals, pre-seasoned meats and dinners, deli meats, canned foods as these foods contain a high amount of sodium/salt.  Make sure you are paying attention to sodium labels on foods you buy at the grocery store. Buy your spices separately such as garlic powder, onion powder, cumin, cayenne, parsley flakes so that you can avoid seasonings that contain salt. However, salt-free seasonings are available and can be used, an example is Mrs. Dash and includes a lot of different mixtures that do not contain salt. ° °Lastly, when cooking using oils that are healthier for you is  important. This includes olive oil, avocado oil, canola oil. We have discussed a lot of foods to avoid but below is a list of foods that can be very healthy to use in your diet whether it is for diabetes, cholesterol, high blood pressure, or in general healthy eating. ° °Salads - kale, spinach, cabbage, spring mix, arugula °Fruits - avocadoes, berries (blueberries, raspberries, blackberries), apples, oranges, pomegranate, grapefruit, kiwi °Vegetables - asparagus, cauliflower, broccoli, green beans, brussel sprouts, bell peppers, beets; stay away from or limit starchy vegetables like potatoes, carrots, peas °Other general foods - kidney beans, egg whites, almonds, walnuts, sunflower seeds, pumpkin seeds, fat free yogurt, almond milk, flax seeds, quinoa, oats  °Meat - It is better to eat lean meats and limit your red meat including pork to once a week.  Wild caught fish, chicken breast are good options as they tend to be leaner sources of good protein. Still be mindful of the sodium labels for the meats you buy. ° °DO NOT EAT ANY FOODS ON THIS LIST THAT YOU ARE ALLERGIC TO. For more specific needs, I highly recommend consulting a dietician or nutritionist but this can definitely be a good starting point. ° °

## 2020-10-02 ENCOUNTER — Ambulatory Visit (HOSPITAL_COMMUNITY)
Admission: EM | Admit: 2020-10-02 | Discharge: 2020-10-02 | Disposition: A | Discharge status: Home / Self Care | Payer: Managed Care, Other (non HMO) | Attending: Physician Assistant | Admitting: Physician Assistant

## 2020-10-02 ENCOUNTER — Other Ambulatory Visit: Payer: Self-pay

## 2020-10-02 ENCOUNTER — Encounter (HOSPITAL_COMMUNITY): Payer: Self-pay

## 2020-10-02 DIAGNOSIS — I10 Essential (primary) hypertension: Secondary | ICD-10-CM | POA: Diagnosis not present

## 2020-10-02 DIAGNOSIS — S0501XA Injury of conjunctiva and corneal abrasion without foreign body, right eye, initial encounter: Secondary | ICD-10-CM | POA: Diagnosis not present

## 2020-10-02 DIAGNOSIS — H5711 Ocular pain, right eye: Secondary | ICD-10-CM | POA: Diagnosis not present

## 2020-10-02 MED ORDER — TETRACAINE HCL 0.5 % OP SOLN
OPHTHALMIC | Status: AC
  Filled 2020-10-02: qty 4

## 2020-10-02 MED ORDER — FLUORESCEIN SODIUM 1 MG OP STRP
ORAL_STRIP | OPHTHALMIC | Status: AC
  Filled 2020-10-02: qty 1

## 2020-10-02 MED ORDER — ERYTHROMYCIN 5 MG/GM OP OINT: TOPICAL_OINTMENT | Freq: Every day | OPHTHALMIC | 0 refills | Status: DC

## 2020-10-02 NOTE — Discharge Instructions (Signed)
Use eye ointment to help protect your eye.  You can use lubricating eyedrops such as Systane but should avoid anything like Clear Eyes as this can worsen symptoms.  If you continue to have pain or additional symptoms need to be seen by an eye doctor.  Please take your blood pressure medicine when you get home.  If your blood pressure remains elevated you need to follow-up with your primary care.  If you develop any severe symptoms including chest pain, shortness of breath, vision changes, headache you need to be evaluated immediately.

## 2020-10-02 NOTE — ED Triage Notes (Signed)
Pt in with c/o right eye pain  States he think he scratched his eye while sleeping and now it is very painful  Pt used eye drops to clear up the redness

## 2020-10-02 NOTE — ED Provider Notes (Signed)
[facility]    CSN: [id number] Arrival date & time: 10/02/20  0801      History   Chief Complaint Chief Complaint  Patient presents with  . Eye Pain    HPI Wesley Wheeler is a 30 y.o. male.   Patient presents today with a several hour history of right eye pain.  Patient reports that he woke up and it feels as though something scratched his eye.  He reports a similar episode approximately 4 to 5 months ago that resolved on its own within a few days.  Currently pain is rated 4 with his eyes closed but increases to 8 if he opens his eye, localized to right arm without radiation, described as scratching, no aggravating or alleviating factors identified.  He has tried clear eye without improvement of symptoms.  He wears glasses but does not wear contacts.  Denies any foreign body sensation or known injury.  He has not seen an ophthalmologist recently.  He denies any drainage, photophobia, visual disturbance, headache, nausea, vomiting.  Patient's blood pressure was noted to be elevated in office today.  He has a history of hypertension and has not taken his medication this morning as he was anxious to get in to be evaluated.  He denies any chest pain, shortness of breath, peripheral edema, headache, visual disturbance.     Past Medical History:  Diagnosis Date  . Hypertension     There are no problems to display for this patient.   Past Surgical History:  Procedure Laterality Date  . NASAL SINUS SURGERY    . NOSE SURGERY     benign tumor       Home Medications    Prior to Admission medications   Medication Sig Start Date End Date Taking? Authorizing Provider  erythromycin ophthalmic ointment Place into the right eye at bedtime. 10/02/20  Yes Ahmadou Bolz, Denny Peon K, PA-C  loperamide (IMODIUM) 2 MG capsule Take 1 capsule (2 mg total) by mouth 2 (two) times daily as needed for diarrhea or loose stools. 09/02/20   Wallis Bamberg, PA-C  losartan (COZAAR) 50 MG tablet Take 1  tablet (50 mg total) by mouth daily. 09/02/20   Wallis Bamberg, PA-C  ondansetron (ZOFRAN-ODT) 8 MG disintegrating tablet Take 1 tablet (8 mg total) by mouth every 8 (eight) hours as needed for nausea or vomiting. 09/02/20   Wallis Bamberg, PA-C  albuterol (VENTOLIN HFA) 108 (90 Base) MCG/ACT inhaler Inhale 1-2 puffs into the lungs every 6 (six) hours as needed for wheezing or shortness of breath. 04/11/19 07/21/19  Domenick Gong, MD  amLODipine (NORVASC) 5 MG tablet Take 5 mg by mouth daily. 12/01/19 09/02/20  [provider]  fluticasone (FLONASE) 50 MCG/ACT nasal spray Place 2 sprays into both nostrils daily. 04/11/19 07/21/19  Domenick Gong, MD    Family History Family History  Problem Relation Age of Onset  . Hypertension Mother   . Diabetes Father     Social History Social History   Tobacco Use  . Smoking status: Current Every Day Smoker  . Smokeless tobacco: Never Used  Vaping Use  . Vaping Use: Never used  Substance Use Topics  . Alcohol use: Yes  . Drug use: No     Allergies   Amoxicillin   Review of Systems Review of Systems  Constitutional: Negative for activity change, appetite change, fatigue and fever.  Eyes: Positive for pain and discharge. Negative for photophobia, redness and visual disturbance.  Respiratory: Negative for cough and shortness  of breath.   Cardiovascular: Negative for chest pain.  Gastrointestinal: Negative for abdominal pain, diarrhea, nausea and vomiting.  Neurological: Negative for dizziness, light-headedness and headaches.     Physical Exam Triage Vital Signs ED Triage Vitals  Enc Vitals Group     BP 10/02/20 0813 (!) 178/103     Pulse Rate 10/02/20 0810 82     Resp 10/02/20 0810 20     Temp 10/02/20 0810 98.9 F (37.2 C)     Temp src --      SpO2 10/02/20 0810 99 %     Weight --      Height --      Head Circumference --      Peak Flow --      Pain Score 10/02/20 0809 8     Pain Loc --      Pain Edu? --      Excl. in  GC? --    No data found.  Updated Vital Signs BP (!) 163/102 (BP Location: Right Arm)   Pulse 83   Temp 98.9 F (37.2 C)   Resp 20   SpO2 98%   Visual Acuity Right Eye Distance: 20/50  with correction Left Eye Distance: 20/50 with correction Bilateral Distance: 20/30   with correction  Right Eye Near:   Left Eye Near:    Bilateral Near:     Physical Exam Vitals reviewed.  Constitutional:      General: He is awake.     Appearance: Normal appearance. He is normal weight. He is not ill-appearing.     Comments: Very pleasant male appears stated age in no acute distress  HENT:     Head: Normocephalic and atraumatic.  Eyes:     Extraocular Movements:     Right eye: Normal extraocular motion.     Left eye: Normal extraocular motion.     Conjunctiva/sclera:     Right eye: Right conjunctiva is injected. No chemosis, exudate or hemorrhage.    Left eye: Left conjunctiva is not injected. No chemosis, exudate or hemorrhage.    Pupils: Pupils are equal, round, and reactive to light.     Right eye: Corneal abrasion present.     Comments: Increased fluorescein uptake noted right upper eye.  Resolution of pain with tetracaine drops.  Cardiovascular:     Rate and Rhythm: Normal rate and regular rhythm.     Heart sounds: No murmur heard.   Pulmonary:     Effort: Pulmonary effort is normal.     Breath sounds: Normal breath sounds. No stridor. No wheezing, rhonchi or rales.  Musculoskeletal:     Cervical back: No spinous process tenderness or muscular tenderness.  Lymphadenopathy:     Head:     Right side of head: No preauricular or posterior auricular adenopathy.     Left side of head: No preauricular or posterior auricular adenopathy.  Neurological:     Mental Status: He is alert.  Psychiatric:        Behavior: Behavior is cooperative.      UC Treatments / Results  Labs (all labs ordered are listed, but only abnormal results are displayed) Labs Reviewed - No data to  display  EKG   Radiology No results found.  Procedures Procedures (including critical care time)  Medications Ordered in UC Medications - No data to display  Initial Impression / Assessment and Plan / UC Course  I have reviewed the triage vital signs and the nursing notes.  Pertinent  labs & imaging results that were available during my care of the patient were reviewed by me and considered in my medical decision making (see chart for details).     Corneal abrasion noted on physical exam.  Patient was prescribed with erythromycin ointment with instruction to use this at night.  He is to avoid touching the tip of medication bottle to his eye and should wash his hands before handling medication to provide contamination.  He can use lubricating eyedrops.  Recommended that he rest.  He can use over-the-counter analgesics for pain relief.  Discussed that if symptoms persist and/or worsen he needs to be seen by ophthalmologist to which he expressed understanding.  Strict return precautions given to which patient expressed understanding.  Blood pressure recheck showed improvement of blood pressure.  Patient was encouraged to return home and monitor his blood pressure at home.  If this remains elevated after taking antihypertensive medication he will need to be evaluated by PCP.  Strict return precautions given to which patient expressed understanding.  Final Clinical Impressions(s) / UC Diagnoses   Final diagnoses:  Abrasion of right cornea, initial encounter  Eye pain, right  Elevated blood pressure reading in office with diagnosis of hypertension     Discharge Instructions     Use eye ointment to help protect your eye.  You can use lubricating eyedrops such as Systane but should avoid anything like Clear Eyes as this can worsen symptoms.  If you continue to have pain or additional symptoms need to be seen by an eye doctor.  Please take your blood pressure medicine when you get home.  If  your blood pressure remains elevated you need to follow-up with your primary care.  If you develop any severe symptoms including chest pain, shortness of breath, vision changes, headache you need to be evaluated immediately.    ED Prescriptions    Medication Sig Dispense Auth. Provider   erythromycin ophthalmic ointment Place into the right eye at bedtime. 3.5 g Herma Uballe K, PA-C     PDMP not reviewed this encounter.   Jeani Hawking, PA-C 10/02/20 0845

## 2020-11-03 ENCOUNTER — Other Ambulatory Visit: Payer: Self-pay

## 2020-11-03 ENCOUNTER — Encounter (HOSPITAL_COMMUNITY): Payer: Self-pay | Admitting: *Deleted

## 2020-11-03 ENCOUNTER — Ambulatory Visit (HOSPITAL_COMMUNITY)
Admission: EM | Admit: 2020-11-03 | Discharge: 2020-11-03 | Disposition: A | Discharge status: Home / Self Care | Payer: Managed Care, Other (non HMO)

## 2020-11-03 DIAGNOSIS — J029 Acute pharyngitis, unspecified: Secondary | ICD-10-CM

## 2020-11-03 NOTE — Discharge Instructions (Addendum)
-  If your symptoms persist, take a home COVID test on day 3 or 4 of symptoms. It's too early to test for covid today. -A sore throat can mean you coming down with a virus, like a cold virus.  If you develop additional symptoms like cough, fevers/chills- take a home covid test or return to see [place]. Try to wear a mask at work and take precautions until we know if you have COVID or not.

## 2020-11-03 NOTE — ED Triage Notes (Signed)
Sore throat started this AM °

## 2020-11-03 NOTE — ED Provider Notes (Signed)
[facility]    CSN: [id number] Arrival date & time: 11/03/20  1316      History   Chief Complaint Chief Complaint  Patient presents with  . Sore Throat    HPI Wesley Wheeler is a 30 y.o. male presenting with sore throat x 8 hours.  Here today for work note.  Medical history hypertension.  States he woke up today with a sore throat.  Denies absolutely any other symptoms, including fever/chills, nasal congestion, cough, nausea/vomiting/diarrhea, abdominal pain, body aches.  Feeling well otherwise.  HPI  Past Medical History:  Diagnosis Date  . Hypertension     There are no problems to display for this patient.   Past Surgical History:  Procedure Laterality Date  . NASAL SINUS SURGERY    . NOSE SURGERY     benign tumor       Home Medications    Prior to Admission medications   Medication Sig Start Date End Date Taking? Authorizing Provider  loperamide (IMODIUM) 2 MG capsule Take 1 capsule (2 mg total) by mouth 2 (two) times daily as needed for diarrhea or loose stools. 09/02/20   Wallis Bamberg, PA-C  losartan (COZAAR) 50 MG tablet Take 1 tablet (50 mg total) by mouth daily. 09/02/20   Wallis Bamberg, PA-C  ondansetron (ZOFRAN-ODT) 8 MG disintegrating tablet Take 1 tablet (8 mg total) by mouth every 8 (eight) hours as needed for nausea or vomiting. 09/02/20   Wallis Bamberg, PA-C  albuterol (VENTOLIN HFA) 108 (90 Base) MCG/ACT inhaler Inhale 1-2 puffs into the lungs every 6 (six) hours as needed for wheezing or shortness of breath. 04/11/19 07/21/19  Domenick Gong, MD  amLODipine (NORVASC) 5 MG tablet Take 5 mg by mouth daily. 12/01/19 09/02/20  [provider]  fluticasone (FLONASE) 50 MCG/ACT nasal spray Place 2 sprays into both nostrils daily. 04/11/19 07/21/19  Domenick Gong, MD    Family History Family History  Problem Relation Age of Onset  . Hypertension Mother   . Diabetes Father     Social History Social History   Tobacco Use  . Smoking  status: Current Every Day Smoker  . Smokeless tobacco: Never Used  Vaping Use  . Vaping Use: Never used  Substance Use Topics  . Alcohol use: Yes  . Drug use: No     Allergies   Amoxicillin   Review of Systems Review of Systems  Constitutional: Negative for appetite change, chills and fever.  HENT: Positive for sore throat. Negative for congestion, ear pain, rhinorrhea, sinus pressure, sinus pain, tinnitus, trouble swallowing and voice change.   Eyes: Negative for redness and visual disturbance.  Respiratory: Negative for cough, chest tightness, shortness of breath and wheezing.   Cardiovascular: Negative for chest pain and palpitations.  Gastrointestinal: Negative for abdominal pain, constipation, diarrhea, nausea and vomiting.  Genitourinary: Negative for dysuria, frequency and urgency.  Musculoskeletal: Negative for myalgias.  Neurological: Negative for dizziness, weakness and headaches.  Psychiatric/Behavioral: Negative for confusion.  All other systems reviewed and are negative.    Physical Exam Triage Vital Signs ED Triage Vitals  Enc Vitals Group     BP 11/03/20 1357 (!) 165/102     Pulse Rate 11/03/20 1357 90     Resp 11/03/20 1357 18     Temp 11/03/20 1357 98.8 F (37.1 C)     Temp src --      SpO2 11/03/20 1357 98 %     Weight --      Height --  Head Circumference --      Peak Flow --      Pain Score 11/03/20 1400 4     Pain Loc --      Pain Edu? --      Excl. in [place]? --    No data found.  Updated Vital Signs BP (!) 165/102   Pulse 90   Temp 98.8 F (37.1 C)   Resp 18   SpO2 98%   Visual Acuity Right Eye Distance:   Left Eye Distance:   Bilateral Distance:    Right Eye Near:   Left Eye Near:    Bilateral Near:     Physical Exam Vitals reviewed.  Constitutional:      General: He is not in acute distress.    Appearance: Normal appearance. He is not ill-appearing.  HENT:     Head: Normocephalic and atraumatic.     Right Ear:  Hearing, tympanic membrane, ear canal and external ear normal. No swelling or tenderness. There is no impacted cerumen. No mastoid tenderness. Tympanic membrane is not perforated, erythematous, retracted or bulging.     Left Ear: Hearing, tympanic membrane, ear canal and external ear normal. No swelling or tenderness. There is no impacted cerumen. No mastoid tenderness. Tympanic membrane is not perforated, erythematous, retracted or bulging.     Nose:     Right Sinus: No maxillary sinus tenderness or frontal sinus tenderness.     Left Sinus: No maxillary sinus tenderness or frontal sinus tenderness.     Mouth/Throat:     Mouth: Mucous membranes are moist.     Pharynx: Uvula midline. Posterior oropharyngeal erythema present. No oropharyngeal exudate.     Tonsils: No tonsillar exudate. 0 on the right. 0 on the left.     Comments: Smooth erythema posterior pharynx  On exam, uvula is midline, he is tolerating secretions without difficulty, there is no trismus, no drooling, he has normal phonation  Cardiovascular:     Rate and Rhythm: Normal rate and regular rhythm.     Heart sounds: Normal heart sounds.  Pulmonary:     Breath sounds: Normal breath sounds and air entry. No wheezing, rhonchi or rales.  Chest:     Chest wall: No tenderness.  Abdominal:     General: Abdomen is flat. Bowel sounds are normal.     Tenderness: There is no abdominal tenderness. There is no guarding or rebound.  Lymphadenopathy:     Cervical: No cervical adenopathy.  Skin:    Capillary Refill: Capillary refill takes less than 2 seconds.  Neurological:     General: No focal deficit present.     Mental Status: He is alert and oriented to person, place, and time.  Psychiatric:        Attention and Perception: Attention and perception normal.        Mood and Affect: Mood and affect normal.        Behavior: Behavior normal. Behavior is cooperative.        Thought Content: Thought content normal.        Judgment:  Judgment normal.      UC Treatments / Results  Labs (all labs ordered are listed, but only abnormal results are displayed) Labs Reviewed - No data to display  EKG   Radiology No results found.  Procedures Procedures (including critical care time)  Medications Ordered in UC Medications - No data to display  Initial Impression / Assessment and Plan / UC Course  I have reviewed  the triage vital signs and the nursing notes.  Pertinent labs & imaging results that were available during my care of the patient were reviewed by me and considered in my medical decision making (see chart for details).     This patient is a 30 year old male presenting with sore throat for a few hours.  He is afebrile, nontachycardic, nontachypneic.  On exam, there is no tonsillar enlargement or cervical adenopathy.  Work note provided stating he can return tomorrow, but wear a mask until home covid test is negative on day 3. Given he has had only 8 hours of symptoms, will defer Covid PCR today. ED return precautions discussed.   Final Clinical Impressions(s) / UC Diagnoses   Final diagnoses:  Pharyngitis, unspecified etiology     Discharge Instructions     -If your symptoms persist, take a home COVID test on day 3 or 4 of symptoms. It's too early to test for covid today. -A sore throat can mean you coming down with a virus, like a cold virus.  If you develop additional symptoms like cough, fevers/chills- take a home covid test or return to see [place]. Try to wear a mask at work and take precautions until we know if you have COVID or not.    ED Prescriptions    None     PDMP not reviewed this encounter.   Rhys Martini, PA-C 11/03/20 1459

## 2020-12-24 ENCOUNTER — Ambulatory Visit (HOSPITAL_COMMUNITY)
Admission: EM | Admit: 2020-12-24 | Discharge: 2020-12-24 | Disposition: A | Discharge status: Home / Self Care | Payer: Managed Care, Other (non HMO) | Attending: Emergency Medicine | Admitting: Emergency Medicine

## 2020-12-24 ENCOUNTER — Other Ambulatory Visit: Payer: Self-pay

## 2020-12-24 ENCOUNTER — Encounter (HOSPITAL_COMMUNITY): Payer: Self-pay | Admitting: *Deleted

## 2020-12-24 DIAGNOSIS — J069 Acute upper respiratory infection, unspecified: Secondary | ICD-10-CM | POA: Insufficient documentation

## 2020-12-24 DIAGNOSIS — Z20822 Contact with and (suspected) exposure to covid-19: Secondary | ICD-10-CM | POA: Diagnosis not present

## 2020-12-24 DIAGNOSIS — Z79899 Other long term (current) drug therapy: Secondary | ICD-10-CM | POA: Insufficient documentation

## 2020-12-24 DIAGNOSIS — F17210 Nicotine dependence, cigarettes, uncomplicated: Secondary | ICD-10-CM | POA: Diagnosis not present

## 2020-12-24 LAB — SARS CORONAVIRUS 2 (TAT 6-24 HRS): SARS Coronavirus 2: NEGATIVE

## 2020-12-24 NOTE — ED Triage Notes (Signed)
Pt reports he is here for COVID test. Pt has had SX's for 24 hr.

## 2020-12-24 NOTE — ED Provider Notes (Signed)
[facility]    CSN: [id number] Arrival date & time: 12/24/20  1247      History   Chief Complaint Chief Complaint  Patient presents with   Nasal Congestion   Cough   Sore Throat    HPI Wesley Wheeler is a 30 y.o. male.   Patient here for evaluation of cough, sore throat, and congestion that has been ongoing for the past day and a half.  Requesting COVID test.  Denies any known sick contacts.  Reports taking some allergy medication and Tylenol which has helped with the congestion.  Denies any fevers, chest pain, shortness of breath, N/V/D, numbness, tingling, weakness, abdominal pain, or headaches.     The history is provided by the patient.  Cough Associated symptoms: sore throat   Associated symptoms: no chest pain and no shortness of breath   Sore Throat Pertinent negatives include no chest pain and no shortness of breath.   Past Medical History:  Diagnosis Date   Hypertension     There are no problems to display for this patient.   Past Surgical History:  Procedure Laterality Date   NASAL SINUS SURGERY     NOSE SURGERY     benign tumor       Home Medications    Prior to Admission medications   Medication Sig Start Date End Date Taking? Authorizing Provider  loperamide (IMODIUM) 2 MG capsule Take 1 capsule (2 mg total) by mouth 2 (two) times daily as needed for diarrhea or loose stools. 09/02/20   Wallis Bamberg, PA-C  losartan (COZAAR) 50 MG tablet Take 1 tablet (50 mg total) by mouth daily. 09/02/20   Wallis Bamberg, PA-C  ondansetron (ZOFRAN-ODT) 8 MG disintegrating tablet Take 1 tablet (8 mg total) by mouth every 8 (eight) hours as needed for nausea or vomiting. 09/02/20   Wallis Bamberg, PA-C  albuterol (VENTOLIN HFA) 108 (90 Base) MCG/ACT inhaler Inhale 1-2 puffs into the lungs every 6 (six) hours as needed for wheezing or shortness of breath. 04/11/19 07/21/19  Domenick Gong, MD  amLODipine (NORVASC) 5 MG tablet Take 5 mg by mouth daily. 12/01/19  09/02/20  [provider]  fluticasone (FLONASE) 50 MCG/ACT nasal spray Place 2 sprays into both nostrils daily. 04/11/19 07/21/19  Domenick Gong, MD    Family History Family History  Problem Relation Age of Onset   Hypertension Mother    Diabetes Father     Social History Social History   Tobacco Use   Smoking status: Every Day    Pack years: 0.00   Smokeless tobacco: Never  Vaping Use   Vaping Use: Never used  Substance Use Topics   Alcohol use: Yes   Drug use: No     Allergies   Amoxicillin   Review of Systems Review of Systems  Constitutional:  Positive for fatigue.  HENT:  Positive for congestion and sore throat.   Respiratory:  Positive for cough. Negative for chest tightness and shortness of breath.   Cardiovascular:  Negative for chest pain.  All other systems reviewed and are negative.   Physical Exam Triage Vital Signs ED Triage Vitals  Enc Vitals Group     BP 12/24/20 1341 (!) 185/101     Pulse Rate 12/24/20 1341 94     Resp 12/24/20 1341 20     Temp 12/24/20 1341 99.4 F (37.4 C)     Temp src --      SpO2 12/24/20 1341 96 %  Weight --      Height --      Head Circumference --      Peak Flow --      Pain Score 12/24/20 1342 0     Pain Loc --      Pain Edu? --      Excl. in GC? --    No data found.  Updated Vital Signs BP (!) 185/101   Pulse 94   Temp 99.4 F (37.4 C)   Resp 20   SpO2 96%   Visual Acuity Right Eye Distance:   Left Eye Distance:   Bilateral Distance:    Right Eye Near:   Left Eye Near:    Bilateral Near:     Physical Exam Vitals and nursing note reviewed.  Constitutional:      General: He is not in acute distress.    Appearance: Normal appearance. He is not ill-appearing, toxic-appearing or diaphoretic.  HENT:     Head: Normocephalic and atraumatic.     Nose: Congestion and rhinorrhea present. Rhinorrhea is clear.     Right Turbinates: Swollen.     Left Turbinates: Swollen.     Mouth/Throat:      Pharynx: Uvula midline. No posterior oropharyngeal erythema or uvula swelling.     Tonsils: No tonsillar exudate or tonsillar abscesses. 1+ on the right. 1+ on the left.  Eyes:     Conjunctiva/sclera: Conjunctivae normal.  Cardiovascular:     Rate and Rhythm: Normal rate and regular rhythm.     Pulses: Normal pulses.     Heart sounds: Normal heart sounds.  Pulmonary:     Effort: Pulmonary effort is normal.     Breath sounds: Normal breath sounds.  Abdominal:     General: Abdomen is flat.  Musculoskeletal:        General: Normal range of motion.     Cervical back: Normal range of motion.  Skin:    General: Skin is warm and dry.  Neurological:     General: No focal deficit present.     Mental Status: He is alert and oriented to person, place, and time.  Psychiatric:        Mood and Affect: Mood normal.     UC Treatments / Results  Labs (all labs ordered are listed, but only abnormal results are displayed) Labs Reviewed  SARS CORONAVIRUS 2 (TAT 6-24 HRS)    EKG   Radiology No results found.  Procedures Procedures (including critical care time)  Medications Ordered in UC Medications - No data to display  Initial Impression / Assessment and Plan / UC Course  I have reviewed the triage vital signs and the nursing notes.  Pertinent labs & imaging results that were available during my care of the patient were reviewed by me and considered in my medical decision making (see chart for details).    Assessment negative for red flags or concerns.  COVID test pending.  Likely viral URI with cough versus COVID.  As symptoms have started within the last few days patient would qualify for antiviral treatment if his test is positive.  No recent BMP so molnupiravir would be the preferred treatment option.  Encourage patient to quarantine while awaiting results and for at least 5 days from symptom onset if test is positive.  Work note supplied to patient.  Discussed conservative  symptom management as described in discharge instructions.  May take Tylenol and/or ibuprofen as needed for pain and fevers.  Recommend continuing allergy  medication.  Follow-up with primary care as needed.  Final Clinical Impressions(s) / UC Diagnoses   Final diagnoses:  Viral URI with cough     Discharge Instructions      We will contact you if your COVID test is positive.  Please quarantine while you wait for the results.  If your test is negative you may resume normal activities.  If your test is positive please continue to quarantine for at least 5 days from your symptom onset or until you are without a fever for at least 24 hours after the medications. You can review the information about the antivirals (paxlovid and molnupiravir) that are available if you test is positive.  You need to start them within 5 days of your symptom onset.   You can take Tylenol and/or Ibuprofen as needed for fever reduction and pain relief.   For cough: honey 1/2 to 1 teaspoon (you can dilute the honey in water or another fluid).  You can also use guaifenesin and dextromethorphan for cough. You can use a humidifier for chest congestion and cough.  If you don't have a humidifier, you can sit in the bathroom with the hot shower running.     For sore throat: try warm salt water gargles, cepacol lozenges, throat spray, warm tea or water with lemon/honey, popsicles or ice, or OTC cold relief medicine for throat discomfort.    For congestion: take a daily anti-histamine like Zyrtec, Claritin, and a oral decongestant, such as pseudoephedrine.  You can also use Flonase 1-2 sprays in each nostril daily.    It is important to stay hydrated: drink plenty of fluids (water, gatorade/powerade/pedialyte, juices, or teas) to keep your throat moisturized and help further relieve irritation/discomfort.   Return or go to the Emergency Department if symptoms worsen or do not improve in the next few days.      ED  Prescriptions   None    PDMP not reviewed this encounter.   Ivette Loyal, NP 12/24/20 1415

## 2020-12-24 NOTE — Discharge Instructions (Addendum)
We will contact you if your COVID test is positive.  Please quarantine while you wait for the results.  If your test is negative you may resume normal activities.  If your test is positive please continue to quarantine for at least 5 days from your symptom onset or until you are without a fever for at least 24 hours after the medications. You can review the information about the antivirals (paxlovid and molnupiravir) that are available if you test is positive.  You need to start them within 5 days of your symptom onset.   You can take Tylenol and/or Ibuprofen as needed for fever reduction and pain relief.   For cough: honey 1/2 to 1 teaspoon (you can dilute the honey in water or another fluid).  You can also use guaifenesin and dextromethorphan for cough. You can use a humidifier for chest congestion and cough.  If you don't have a humidifier, you can sit in the bathroom with the hot shower running.     For sore throat: try warm salt water gargles, cepacol lozenges, throat spray, warm tea or water with lemon/honey, popsicles or ice, or OTC cold relief medicine for throat discomfort.    For congestion: take a daily anti-histamine like Zyrtec, Claritin, and a oral decongestant, such as pseudoephedrine.  You can also use Flonase 1-2 sprays in each nostril daily.    It is important to stay hydrated: drink plenty of fluids (water, gatorade/powerade/pedialyte, juices, or teas) to keep your throat moisturized and help further relieve irritation/discomfort.   Return or go to the Emergency Department if symptoms worsen or do not improve in the next few days.

## 2021-05-02 ENCOUNTER — Ambulatory Visit (HOSPITAL_COMMUNITY)
Admission: EM | Admit: 2021-05-02 | Discharge: 2021-05-02 | Disposition: A | Discharge status: Home / Self Care | Payer: Managed Care, Other (non HMO) | Attending: Internal Medicine | Admitting: Internal Medicine

## 2021-05-02 ENCOUNTER — Other Ambulatory Visit: Payer: Self-pay

## 2021-05-02 ENCOUNTER — Encounter (HOSPITAL_COMMUNITY): Payer: Self-pay | Admitting: Emergency Medicine

## 2021-05-02 DIAGNOSIS — H1031 Unspecified acute conjunctivitis, right eye: Secondary | ICD-10-CM

## 2021-05-02 MED ORDER — FLUORESCEIN SODIUM 1 MG OP STRP
ORAL_STRIP | OPHTHALMIC | Status: AC
  Filled 2021-05-02: qty 5

## 2021-05-02 MED ORDER — POLYMYXIN B-TRIMETHOPRIM 10000-0.1 UNIT/ML-% OP SOLN: 2.0000 [drp] | Freq: Four times a day (QID) | OPHTHALMIC | 0 refills | Status: DC

## 2021-05-02 MED ORDER — TETRACAINE HCL 0.5 % OP SOLN
OPHTHALMIC | Status: AC
  Filled 2021-05-02: qty 4

## 2021-05-02 NOTE — Discharge Instructions (Addendum)
Antibiotic eyedrops have been prescribed.  Please take these for 7 days.  Use a warm compress if needed in mornings to clean eye.  Try to avoid rubbing eye.  Wash hands frequently.  Please follow-up for any worsening or persistent symptoms

## 2021-05-02 NOTE — ED Triage Notes (Addendum)
Patient c/o RT eye redness and pain that started last night.   Patient denies any fall or trauma to eye.   Patient denies any vision changes.   Patient endorses eye crustiness this morning and drainage.   Patient used " an eye drop" with some relief of pain.

## 2021-05-02 NOTE — ED Provider Notes (Signed)
[place]    CSN: [id number] Arrival date & time: 05/02/21  1422      History   Chief Complaint Chief Complaint  Patient presents with   Eye Problem    HPI Wesley Wheeler is a 30 y.o. male presents to urgent care with complaints of right eye redness and pain starting last evening.  Patient reports noticing burning and stinging sensation in right eye last evening with associated watering followed by yellow drainage.  Awoke this morning with eye "glued shut" and crusted over.  He denies any loss of vision, significant pain, known trauma to the eye.  He does not wear contact lenses.   Past Medical History:  Diagnosis Date   Hypertension     There are no problems to display for this patient.   Past Surgical History:  Procedure Laterality Date   NASAL SINUS SURGERY     NOSE SURGERY     benign tumor       Home Medications    Prior to Admission medications   Medication Sig Start Date End Date Taking? Authorizing Provider  trimethoprim-polymyxin b (POLYTRIM) ophthalmic solution Place 2 drops into the right eye every 6 (six) hours. 05/02/21  Yes Rudolpho Sevin, NP  loperamide (IMODIUM) 2 MG capsule Take 1 capsule (2 mg total) by mouth 2 (two) times daily as needed for diarrhea or loose stools. 09/02/20   Jaynee Eagles, PA-C  losartan (COZAAR) 50 MG tablet Take 1 tablet (50 mg total) by mouth daily. 09/02/20   Jaynee Eagles, PA-C  ondansetron (ZOFRAN-ODT) 8 MG disintegrating tablet Take 1 tablet (8 mg total) by mouth every 8 (eight) hours as needed for nausea or vomiting. 09/02/20   Jaynee Eagles, PA-C  albuterol (VENTOLIN HFA) 108 (90 Base) MCG/ACT inhaler Inhale 1-2 puffs into the lungs every 6 (six) hours as needed for wheezing or shortness of breath. 04/11/19 07/21/19  Melynda Ripple, MD  amLODipine (NORVASC) 5 MG tablet Take 5 mg by mouth daily. 12/01/19 09/02/20  [provider]  fluticasone (FLONASE) 50 MCG/ACT nasal spray Place 2 sprays into both nostrils  daily. 04/11/19 07/21/19  Melynda Ripple, MD    Family History Family History  Problem Relation Age of Onset   Hypertension Mother    Diabetes Father     Social History Social History   Tobacco Use   Smoking status: Every Day   Smokeless tobacco: Never  Vaping Use   Vaping Use: Never used  Substance Use Topics   Alcohol use: Yes   Drug use: No     Allergies   Amoxicillin   Review of Systems As stated in HPI otherwise negative   Physical Exam Triage Vital Signs ED Triage Vitals  Enc Vitals Group     BP 05/02/21 1547 (!) 154/96     Pulse Rate 05/02/21 1547 78     Resp 05/02/21 1547 18     Temp 05/02/21 1547 97.9 F (36.6 C)     Temp Source 05/02/21 1547 Oral     SpO2 05/02/21 1547 96 %     Weight --      Height --      Head Circumference --      Peak Flow --      Pain Score 05/02/21 1546 4     Pain Loc --      Pain Edu? --      Excl. in [place]? --    No data found.  Updated Vital Signs BP Marland Kitchen)  154/96 (BP Location: Right Arm)   Pulse 78   Temp 97.9 F (36.6 C) (Oral)   Resp 18   SpO2 96%   Visual Acuity Right Eye Distance:   Left Eye Distance:   Bilateral Distance:    Right Eye Near:   Left Eye Near:    Bilateral Near:     Physical Exam Constitutional:      General: He is not in acute distress.    Appearance: Normal appearance. He is not ill-appearing or toxic-appearing.  HENT:     Head: Atraumatic.  Eyes:     General: Lids are normal. Vision grossly intact.        Right eye: No foreign body.     Extraocular Movements: Extraocular movements intact.     Conjunctiva/sclera:     Right eye: Right conjunctiva is injected.     Pupils: Pupils are equal, round, and reactive to light.     Comments: Erythema along inner corneal edge of right eye extending diffusely to lower and inner scleral region.  Clear drainage  Neurological:     Mental Status: He is alert.  Psychiatric:        Mood and Affect: Mood normal.        Behavior: Behavior normal.      UC Treatments / Results  Labs (all labs ordered are listed, but only abnormal results are displayed) Labs Reviewed - No data to display  EKG   Radiology No results found.  Procedures Procedures (including critical care time)  Medications Ordered in UC Medications - No data to display  Initial Impression / Assessment and Plan / UC Course  I have reviewed the triage vital signs and the nursing notes.  Pertinent labs & imaging results that were available during my care of the patient were reviewed by me and considered in my medical decision making (see chart for details).  Bacterial conjunctivitis -Fluorescein test strip performed with no evidence of corneal abrasion -Exam and symptoms consistent with bacterial conjunctivitis -Trimethoprim gtts x 7 days, warm compresses as needed -Follow-up for persistent or worsening symptoms  Reviewed expections re: course of current medical issues. Questions answered. Outlined signs and symptoms indicating need for more acute intervention. Pt verbalized understanding. AVS given   Final Clinical Impressions(s) / UC Diagnoses   Final diagnoses:  Acute bacterial conjunctivitis of right eye     Discharge Instructions      Antibiotic eyedrops have been prescribed.  Please take these for 7 days.  Use a warm compress if needed in mornings to clean eye.  Try to avoid rubbing eye.  Wash hands frequently.  Please follow-up for any worsening or persistent symptoms     ED Prescriptions     Medication Sig Dispense Auth. Provider   trimethoprim-polymyxin b (POLYTRIM) ophthalmic solution Place 2 drops into the right eye every 6 (six) hours. 10 mL Rolla Etienne, NP      PDMP not reviewed this encounter.   Rolla Etienne, NP 05/02/21 1729

## 2022-05-14 ENCOUNTER — Encounter (HOSPITAL_COMMUNITY): Payer: Self-pay

## 2022-05-14 ENCOUNTER — Other Ambulatory Visit: Payer: Self-pay

## 2022-05-14 ENCOUNTER — Emergency Department (HOSPITAL_BASED_OUTPATIENT_CLINIC_OR_DEPARTMENT_OTHER): Payer: Self-pay

## 2022-05-14 ENCOUNTER — Emergency Department (HOSPITAL_BASED_OUTPATIENT_CLINIC_OR_DEPARTMENT_OTHER): Payer: Self-pay | Admitting: Radiology

## 2022-05-14 ENCOUNTER — Ambulatory Visit (HOSPITAL_COMMUNITY)
Admission: EM | Admit: 2022-05-14 | Discharge: 2022-05-14 | Disposition: A | Discharge status: Home / Self Care | Payer: Self-pay

## 2022-05-14 ENCOUNTER — Encounter (HOSPITAL_BASED_OUTPATIENT_CLINIC_OR_DEPARTMENT_OTHER): Payer: Self-pay | Admitting: Radiology

## 2022-05-14 ENCOUNTER — Emergency Department (HOSPITAL_COMMUNITY): Admission: EM | Admit: 2022-05-14 | Discharge: 2022-05-14 | Discharge status: Against Medical Advice | Payer: Self-pay

## 2022-05-14 ENCOUNTER — Inpatient Hospital Stay (HOSPITAL_BASED_OUTPATIENT_CLINIC_OR_DEPARTMENT_OTHER)
Admission: EM | Admit: 2022-05-14 | Discharge: 2022-05-16 | DRG: 193 | Disposition: A | Discharge status: Home / Self Care | Payer: Self-pay | Attending: Internal Medicine | Admitting: Internal Medicine

## 2022-05-14 DIAGNOSIS — J4541 Moderate persistent asthma with (acute) exacerbation: Secondary | ICD-10-CM

## 2022-05-14 DIAGNOSIS — J45909 Unspecified asthma, uncomplicated: Secondary | ICD-10-CM | POA: Diagnosis present

## 2022-05-14 DIAGNOSIS — I10 Essential (primary) hypertension: Secondary | ICD-10-CM | POA: Diagnosis present

## 2022-05-14 DIAGNOSIS — Z1152 Encounter for screening for COVID-19: Secondary | ICD-10-CM

## 2022-05-14 DIAGNOSIS — Z833 Family history of diabetes mellitus: Secondary | ICD-10-CM

## 2022-05-14 DIAGNOSIS — Z720 Tobacco use: Secondary | ICD-10-CM | POA: Diagnosis present

## 2022-05-14 DIAGNOSIS — J189 Pneumonia, unspecified organism: Principal | ICD-10-CM | POA: Diagnosis present

## 2022-05-14 DIAGNOSIS — J9601 Acute respiratory failure with hypoxia: Secondary | ICD-10-CM | POA: Diagnosis present

## 2022-05-14 DIAGNOSIS — Z6841 Body Mass Index (BMI) 40.0 and over, adult: Secondary | ICD-10-CM

## 2022-05-14 DIAGNOSIS — Z880 Allergy status to penicillin: Secondary | ICD-10-CM

## 2022-05-14 DIAGNOSIS — G4733 Obstructive sleep apnea (adult) (pediatric): Secondary | ICD-10-CM | POA: Diagnosis present

## 2022-05-14 DIAGNOSIS — R000 Tachycardia, unspecified: Secondary | ICD-10-CM | POA: Diagnosis present

## 2022-05-14 DIAGNOSIS — F17210 Nicotine dependence, cigarettes, uncomplicated: Secondary | ICD-10-CM | POA: Diagnosis present

## 2022-05-14 DIAGNOSIS — Z8249 Family history of ischemic heart disease and other diseases of the circulatory system: Secondary | ICD-10-CM

## 2022-05-14 DIAGNOSIS — B348 Other viral infections of unspecified site: Secondary | ICD-10-CM | POA: Diagnosis present

## 2022-05-14 DIAGNOSIS — Z1611 Resistance to penicillins: Secondary | ICD-10-CM | POA: Diagnosis present

## 2022-05-14 DIAGNOSIS — Z1623 Resistance to quinolones and fluoroquinolones: Secondary | ICD-10-CM | POA: Diagnosis present

## 2022-05-14 DIAGNOSIS — R0902 Hypoxemia: Secondary | ICD-10-CM

## 2022-05-14 HISTORY — DX: Sleep apnea, unspecified: G47.30

## 2022-05-14 LAB — COMPREHENSIVE METABOLIC PANEL
ALT: 37 U/L (ref 0–44)
AST: 23 U/L (ref 15–41)
Albumin: 5 g/dL (ref 3.5–5.0)
Alkaline Phosphatase: 42 U/L (ref 38–126)
Anion gap: 9 (ref 5–15)
BUN: 10 mg/dL (ref 6–20)
CO2: 29 mmol/L (ref 22–32)
Calcium: 9.5 mg/dL (ref 8.9–10.3)
Chloride: 100 mmol/L (ref 98–111)
Creatinine, Ser: 0.83 mg/dL (ref 0.61–1.24)
GFR, Estimated: >60 mL/min (ref >60)
Glucose, Bld: 96 mg/dL (ref 70–99)
Potassium: 4 mmol/L (ref 3.5–5.1)
Sodium: 138 mmol/L (ref 135–145)
Total Bilirubin: 0.9 mg/dL (ref 0.3–1.2)
Total Protein: 8.6 g/dL — ABNORMAL HIGH (ref 6.5–8.1)

## 2022-05-14 LAB — CBC WITH DIFFERENTIAL/PLATELET
Abs Immature Granulocytes: 0.02 10*3/uL (ref 0.00–0.07)
Basophils Absolute: 0 10*3/uL (ref 0.0–0.1)
Basophils Relative: 0 %
Eosinophils Absolute: 0.1 10*3/uL (ref 0.0–0.5)
Eosinophils Relative: 1 %
HCT: 45 % (ref 39.0–52.0)
Hemoglobin: 14.9 g/dL (ref 13.0–17.0)
Immature Granulocytes: 0 %
Lymphocytes Relative: 17 %
Lymphs Abs: 1.4 10*3/uL (ref 0.7–4.0)
MCH: 28.4 pg (ref 26.0–34.0)
MCHC: 33.1 g/dL (ref 30.0–36.0)
MCV: 85.7 fL (ref 80.0–100.0)
Monocytes Absolute: 1 10*3/uL (ref 0.1–1.0)
Monocytes Relative: 13 %
Neutro Abs: 5.7 10*3/uL (ref 1.7–7.7)
Neutrophils Relative %: 69 %
Platelets: 241 10*3/uL (ref 150–400)
RBC: 5.25 MIL/uL (ref 4.22–5.81)
RDW: 14 % (ref 11.5–15.5)
WBC: 8.2 10*3/uL (ref 4.0–10.5)
nRBC: 0 % (ref 0.0–0.2)

## 2022-05-14 LAB — RESPIRATORY PANEL BY PCR

## 2022-05-14 LAB — URINALYSIS, ROUTINE W REFLEX MICROSCOPIC
Bilirubin Urine: NEGATIVE
Glucose, UA: NEGATIVE mg/dL
Hgb urine dipstick: NEGATIVE
Ketones, ur: NEGATIVE mg/dL
Leukocytes,Ua: NEGATIVE
Nitrite: NEGATIVE
Protein, ur: 30 mg/dL — AB
Specific Gravity, Urine: >1.046 — ABNORMAL HIGH (ref 1.005–1.030)
pH: 5.5 (ref 5.0–8.0)

## 2022-05-14 LAB — RESP PANEL BY RT-PCR (FLU A&B, COVID) ARPGX2
Influenza A by PCR: NEGATIVE
Influenza B by PCR: NEGATIVE
SARS Coronavirus 2 by RT PCR: NEGATIVE

## 2022-05-14 LAB — APTT: aPTT: 33 seconds (ref 24–36)

## 2022-05-14 LAB — BLOOD GAS, VENOUS
Acid-Base Excess: 7 mmol/L — ABNORMAL HIGH (ref 0.0–2.0)
Bicarbonate: 33.4 mmol/L — ABNORMAL HIGH (ref 20.0–28.0)
O2 Saturation: 75.6 %
Patient temperature: 37
pCO2, Ven: 54 mmHg (ref 44–60)
pH, Ven: 7.4 (ref 7.25–7.43)
pO2, Ven: 44 mmHg (ref 32–45)

## 2022-05-14 LAB — PROTIME-INR
INR: 1 (ref 0.8–1.2)
Prothrombin Time: 12.7 seconds (ref 11.4–15.2)

## 2022-05-14 LAB — MAGNESIUM: Magnesium: 2.3 mg/dL (ref 1.7–2.4)

## 2022-05-14 LAB — CK: Total CK: 254 U/L (ref 49–397)

## 2022-05-14 LAB — PHOSPHORUS: Phosphorus: 4 mg/dL (ref 2.5–4.6)

## 2022-05-14 LAB — LACTIC ACID, PLASMA: Lactic Acid, Venous: 0.6 mmol/L (ref 0.5–1.9)

## 2022-05-14 MED ORDER — SODIUM CHLORIDE 0.9 % IV SOLN
500.0000 mg | INTRAVENOUS | Status: DC
  Administered 2022-05-15: 500 mg via INTRAVENOUS
  Filled 2022-05-14 (×2): qty 5

## 2022-05-14 MED ORDER — LACTATED RINGERS IV BOLUS
1000.0000 mL | Freq: Once | INTRAVENOUS | Status: AC
  Administered 2022-05-14: 1000 mL via INTRAVENOUS

## 2022-05-14 MED ORDER — SODIUM CHLORIDE 0.9 % IV SOLN: 1.0000 g | INTRAVENOUS | Status: DC

## 2022-05-14 MED ORDER — ACETAMINOPHEN 500 MG PO TABS
1000.0000 mg | ORAL_TABLET | Freq: Once | ORAL | Status: AC
  Administered 2022-05-14: 1000 mg via ORAL
  Filled 2022-05-14: qty 2

## 2022-05-14 MED ORDER — ALBUTEROL SULFATE (2.5 MG/3ML) 0.083% IN NEBU
INHALATION_SOLUTION | RESPIRATORY_TRACT | Status: AC
  Filled 2022-05-14: qty 6

## 2022-05-14 MED ORDER — SODIUM CHLORIDE 0.9 % IV SOLN: INTRAVENOUS | Status: AC

## 2022-05-14 MED ORDER — SODIUM CHLORIDE 0.9 % IV SOLN
2.0000 g | INTRAVENOUS | Status: DC
  Administered 2022-05-15: 2 g via INTRAVENOUS
  Filled 2022-05-14: qty 20

## 2022-05-14 MED ORDER — ALBUTEROL SULFATE HFA 108 (90 BASE) MCG/ACT IN AERS
INHALATION_SPRAY | RESPIRATORY_TRACT | Status: AC
  Filled 2022-05-14: qty 6.7

## 2022-05-14 MED ORDER — IOHEXOL 350 MG/ML SOLN
100.0000 mL | Freq: Once | INTRAVENOUS | Status: AC | PRN
  Administered 2022-05-14: 100 mL via INTRAVENOUS

## 2022-05-14 MED ORDER — IPRATROPIUM-ALBUTEROL 0.5-2.5 (3) MG/3ML IN SOLN: 3.0000 mL | Freq: Four times a day (QID) | RESPIRATORY_TRACT | Status: DC

## 2022-05-14 MED ORDER — ALBUTEROL SULFATE (2.5 MG/3ML) 0.083% IN NEBU: 2.5000 mg | INHALATION_SOLUTION | RESPIRATORY_TRACT | Status: DC | PRN

## 2022-05-14 MED ORDER — NICOTINE 14 MG/24HR TD PT24
14.0000 mg | MEDICATED_PATCH | Freq: Every day | TRANSDERMAL | Status: DC
  Filled 2022-05-14: qty 1

## 2022-05-14 MED ORDER — ACETAMINOPHEN 325 MG PO TABS
650.0000 mg | ORAL_TABLET | Freq: Four times a day (QID) | ORAL | Status: DC | PRN
  Administered 2022-05-14 – 2022-05-15 (×2): 650 mg via ORAL
  Filled 2022-05-14 (×2): qty 2

## 2022-05-14 MED ORDER — ENOXAPARIN SODIUM 80 MG/0.8ML IJ SOSY
75.0000 mg | PREFILLED_SYRINGE | INTRAMUSCULAR | Status: DC
  Administered 2022-05-15 – 2022-05-16 (×2): 75 mg via SUBCUTANEOUS
  Filled 2022-05-14 (×2): qty 0.8

## 2022-05-14 MED ORDER — SODIUM CHLORIDE 0.9 % IV SOLN
500.0000 mg | Freq: Once | INTRAVENOUS | Status: AC
  Administered 2022-05-14: 500 mg via INTRAVENOUS
  Filled 2022-05-14: qty 5

## 2022-05-14 MED ORDER — GUAIFENESIN ER 600 MG PO TB12
600.0000 mg | ORAL_TABLET | Freq: Two times a day (BID) | ORAL | Status: DC
  Administered 2022-05-14 – 2022-05-16 (×4): 600 mg via ORAL
  Filled 2022-05-14 (×4): qty 1

## 2022-05-14 MED ORDER — IPRATROPIUM-ALBUTEROL 0.5-2.5 (3) MG/3ML IN SOLN
3.0000 mL | Freq: Four times a day (QID) | RESPIRATORY_TRACT | Status: DC
  Administered 2022-05-15: 3 mL via RESPIRATORY_TRACT

## 2022-05-14 MED ORDER — ALBUTEROL SULFATE (2.5 MG/3ML) 0.083% IN NEBU
5.0000 mg | INHALATION_SOLUTION | Freq: Once | RESPIRATORY_TRACT | Status: AC
  Administered 2022-05-14: 5 mg via RESPIRATORY_TRACT

## 2022-05-14 MED ORDER — ACETAMINOPHEN 650 MG RE SUPP: 650.0000 mg | Freq: Four times a day (QID) | RECTAL | Status: DC | PRN

## 2022-05-14 MED ORDER — HYDROCODONE-ACETAMINOPHEN 5-325 MG PO TABS: 1.0000 | ORAL_TABLET | ORAL | Status: DC | PRN

## 2022-05-14 MED ORDER — SODIUM CHLORIDE 0.9 % IV SOLN
1.0000 g | Freq: Once | INTRAVENOUS | Status: AC
  Administered 2022-05-14: 1 g via INTRAVENOUS
  Filled 2022-05-14: qty 10

## 2022-05-14 MED ORDER — IPRATROPIUM-ALBUTEROL 0.5-2.5 (3) MG/3ML IN SOLN
3.0000 mL | Freq: Four times a day (QID) | RESPIRATORY_TRACT | Status: DC
  Administered 2022-05-14 – 2022-05-15 (×3): 3 mL via RESPIRATORY_TRACT
  Filled 2022-05-14 (×4): qty 3

## 2022-05-14 MED ORDER — METHYLPREDNISOLONE SODIUM SUCC 40 MG IJ SOLR
40.0000 mg | INTRAMUSCULAR | Status: DC
  Filled 2022-05-14: qty 1

## 2022-05-14 MED ORDER — SODIUM CHLORIDE 0.9 % IV SOLN: 500.0000 mg | INTRAVENOUS | Status: DC

## 2022-05-14 NOTE — ED Provider Notes (Signed)
Patient presents to urgent care with shortness of breath, mild tachypnea, mild hypoxia that started last night. He denies history of asthma. Does have known OSA. Denies history of HTN but does have amlodipine in chart- states he does not take any medication for blood pressure.    Tomi Bamberger, PA-C 05/14/22 1207

## 2022-05-14 NOTE — H&P (Signed)
Wesley Wheeler:[id number] DOB: 03-31-1991 DOA: 05/14/2022     PCP: Patient, No Pcp Per   Outpatient Specialists:   NONE    Patient arrived to ER on 05/14/22 at 1240 Referred by Attending Glyn Ade, MD   Patient coming from:    home Lives With family    Chief Complaint:   Chief Complaint  Patient presents with   Shortness of Breath    HPI: Wesley Wheeler is a 31 y.o. male with medical history significant of morbid obesity, OSA, hypertension, tobacco abuse    Presented with   fever shortness of breath Presents with shortness of breath cough and fever and headache the past 24 hours shortness of breath with exertion on arrival satting 86% on room air tested negative for COVID at home And she was seen in urgent care and sent to emergency department Patient has morbid obesity and OSA at baseline and hypertension but he does not take any medications Otherwise healthy does endorse tobacco abuse Has been having some wheezing  Smokes 2 black and mild a day  Social EtOH   No hx of asthma  He start to have some wheezing No sick contacts  He works in a wear house No bleeding   Initial COVID TEST  NEGATIVE   Lab Results  Component Value Date   SARSCOV2NAA NEGATIVE 05/14/2022   SARSCOV2NAA NEGATIVE 12/24/2020   SARSCOV2NAA POSITIVE (A) 03/18/2020   SARSCOV2NAA NOT DETECTED 07/21/2019     Regarding pertinent Chronic problems:     Morbid obesity-   BMI Readings from Last 1 Encounters:  05/14/22 41.50 kg/m      OSA -on  CPAP     While in ER:   Started on Rocephin azithromycin given IV fluid bolus nebulizer solution CTA showing multifocal pneumonia versus bronchiolitis but no PE    CXR - Borderline cardiomegaly. Otherwise, no radiographic evidence of acute cardiopulmonary process.      CTA chest - no PE evidence of infiltrate Patchy subtle ground-glass in the RIGHT upper lobe associated with mild bronchial wall thickening. Patchy ground-glass in  the RIGHT lower lobe with multifocal nodularity. Largest area of discrete nodularity 8 x 7 mm. Findings are favored to represent sequela of infectious bronchiolitis. Given nodular appearance and the presence of top-normal lymph nodes would suggest 8-12 week follow-up to ensure resolution 3. Mildly enlarged RIGHT hilar and infrahilar lymph nodes are likely reactive.  Following Medications were ordered in ER: Medications  albuterol (PROVENTIL) (2.5 MG/3ML) 0.083% nebulizer solution (  Not Given 05/14/22 1318)  ipratropium-albuterol (DUONEB) 0.5-2.5 (3) MG/3ML nebulizer solution 3 mL (3 mLs Nebulization Given 05/14/22 2005)  cefTRIAXone (ROCEPHIN) 1 g in sodium chloride 0.9 % 100 mL IVPB (has no administration in time range)  azithromycin (ZITHROMAX) 500 mg in sodium chloride 0.9 % 250 mL IVPB (has no administration in time range)  albuterol (VENTOLIN HFA) 108 (90 Base) MCG/ACT inhaler (  Given 05/14/22 1258)  albuterol (PROVENTIL) (2.5 MG/3ML) 0.083% nebulizer solution 5 mg (5 mg Nebulization Given 05/14/22 1317)  cefTRIAXone (ROCEPHIN) 1 g in sodium chloride 0.9 % 100 mL IVPB (0 g Intravenous Stopped 05/14/22 1912)  azithromycin (ZITHROMAX) 500 mg in sodium chloride 0.9 % 250 mL IVPB (0 mg Intravenous Stopped 05/14/22 1912)  acetaminophen (TYLENOL) tablet 1,000 mg (1,000 mg Oral Given 05/14/22 1544)  lactated ringers bolus 1,000 mL (0 mLs Intravenous Stopped 05/14/22 1912)  iohexol (OMNIPAQUE) 350 MG/ML injection 100 mL (100 mLs Intravenous Contrast Given 05/14/22 1503)  lactated ringers bolus 1,000 mL (0 mLs Intravenous Stopped 05/14/22 1912)       ED Triage Vitals  Enc Vitals Group     BP 05/14/22 1251 (!) 163/93     Pulse Rate 05/14/22 1251 (!) 103     Resp 05/14/22 1251 (!) 28     Temp 05/14/22 1256 (!) 100.9 F (38.3 C)     Temp Source 05/14/22 1256 Oral     SpO2 05/14/22 1245 (!) 86 %     Weight 05/14/22 2139 (!) 332 lb (150.6 kg)     Height 05/14/22 2139  (1.905 m)      Head Circumference --      Peak Flow --      Pain Score --      Pain Loc --      Pain Edu? --      Excl. in GC? --   TMAX(24)@     _________________________________________ Significant initial  Findings: Abnormal Labs Reviewed  RESPIRATORY PANEL BY PCR - Abnormal; Notable for the following components:      Result Value   Parainfluenza Virus 4 DETECTED (*)    All other components within normal limits  COMPREHENSIVE METABOLIC PANEL - Abnormal; Notable for the following components:   Total Protein 8.6 (*)    All other components within normal limits  URINALYSIS, ROUTINE W REFLEX MICROSCOPIC - Abnormal; Notable for the following components:   Specific Gravity, Urine >1.046 (*)    Protein, ur 30 (*)    All other components within normal limits    ECG: Ordered Personally reviewed and interpreted by me showing: HR : 100 Rhythm:   Sinus tachycardia *Normal sinus rhythm Normal ECG  QTC 466   ____________________ This patient meets SIRS Criteria and may be septic.    The recent clinical data is shown below. Vitals:   05/14/22 2005 05/14/22 2030 05/14/22 2132 05/14/22 2139  BP:  (!) 160/96 (!) 161/94   Pulse:  87 86   Resp:  18 20   Temp:   98.9 F (37.2 C)   TempSrc:   Oral   SpO2: 92% 92% 94%   Weight:    (!) 150.6 kg  Height:     (1.905 m)    WBC     Component Value Date/Time   WBC 8.2 05/14/2022 1306   LYMPHSABS 1.4 05/14/2022 1306   MONOABS 1.0 05/14/2022 1306   EOSABS 0.1 05/14/2022 1306   BASOSABS 0.0 05/14/2022 1306    Lactic Acid, Venous    Component Value Date/Time   LATICACIDVEN 0.6 05/14/2022 1306    Procalcitonin   Ordered      UA   no evidence of UTI      Urine analysis:    Component Value Date/Time   COLORURINE YELLOW 05/14/2022 1528   APPEARANCEUR CLEAR 05/14/2022 1528   LABSPEC >1.046 (H) 05/14/2022 1528   PHURINE 5.5 05/14/2022 1528   GLUCOSEU NEGATIVE 05/14/2022 1528   HGBUR NEGATIVE 05/14/2022 1528   BILIRUBINUR NEGATIVE  05/14/2022 1528   KETONESUR NEGATIVE 05/14/2022 1528   PROTEINUR 30 (A) 05/14/2022 1528   NITRITE NEGATIVE 05/14/2022 1528   LEUKOCYTESUR NEGATIVE 05/14/2022 1528    Results for orders placed or performed during the hospital encounter of 05/14/22  Resp Panel by RT-PCR (Flu A&B, Covid) Anterior Nasal Swab     Status: None   Collection Time: 05/14/22 12:55 PM   Specimen: Anterior Nasal Swab  Result Value Ref Range Status   SARS  Coronavirus 2 by RT PCR NEGATIVE NEGATIVE Final         Influenza A by PCR NEGATIVE NEGATIVE Final   Influenza B by PCR NEGATIVE NEGATIVE Final        Respiratory (~20 pathogens) panel by PCR     Status: Abnormal   Collection Time: 05/14/22  4:49 PM   Specimen: Nasopharyngeal Swab; Respiratory  Result Value Ref Range Status   Adenovirus NOT DETECTED NOT DETECTED Final   Coronavirus 229E NOT DETECTED NOT DETECTED Final    Comment: (NOTE) The Coronavirus on the Respiratory Panel, DOES NOT test for the novel  Coronavirus (2019 nCoV)    Coronavirus HKU1 NOT DETECTED NOT DETECTED Final   Coronavirus NL63 NOT DETECTED NOT DETECTED Final   Coronavirus OC43 NOT DETECTED NOT DETECTED Final   Metapneumovirus NOT DETECTED NOT DETECTED Final   Rhinovirus / Enterovirus NOT DETECTED NOT DETECTED Final   Influenza A NOT DETECTED NOT DETECTED Final   Influenza B NOT DETECTED NOT DETECTED Final   Parainfluenza Virus 1 NOT DETECTED NOT DETECTED Final   Parainfluenza Virus 2 NOT DETECTED NOT DETECTED Final   Parainfluenza Virus 3 NOT DETECTED NOT DETECTED Final   Parainfluenza Virus 4 DETECTED (A) NOT DETECTED Final   Respiratory Syncytial Virus NOT DETECTED NOT DETECTED Final   Bordetella pertussis NOT DETECTED NOT DETECTED Final   Bordetella Parapertussis NOT DETECTED NOT DETECTED Final   Chlamydophila pneumoniae NOT DETECTED NOT DETECTED Final   Mycoplasma pneumoniae NOT DETECTED NOT DETECTED Final    Comment: Performed at [facility], [address],  [place], [place] [address]   _________________________________________ Hospitalist was called for admission for   Pneumonia of right lung due to infectious organism, unspecified part of lung    The following Work up has been ordered so far:  Orders Placed This Encounter  Procedures   Culture, blood (Routine x 2)   Resp Panel by RT-PCR (Flu A&B, Covid) Anterior Nasal Swab   Respiratory (~20 pathogens) panel by PCR   DG Chest 2 View   CT Angio Chest Pulmonary Embolism (PE) W or WO Contrast   Comprehensive metabolic panel   CBC with Differential   Protime-INR   Urinalysis, Routine w reflex microscopic   Notify physician (specify)  Specify: Notify provider for possible Code Sepsis   Document height and weight   Cardiac monitoring   Consult to hospitalist   Admit to Inpatient (patient's expected length of stay will be greater than 2 midnights or inpatient only procedure)     OTHER Significant initial  Findings:  labs showing:    Recent Labs  Lab 05/14/22 1306  NA 138  K 4.0  CO2 29  GLUCOSE 96  BUN 10  CREATININE 0.83  CALCIUM 9.5    Cr    stable,    Lab Results  Component Value Date   CREATININE 0.83 05/14/2022    Recent Labs  Lab 05/14/22 1306  AST 23  ALT 37  ALKPHOS 42  BILITOT 0.9  PROT 8.6*  ALBUMIN 5.0   Lab Results  Component Value Date   CALCIUM 9.5 05/14/2022    Plt: Lab Results  Component Value Date   PLT 241 05/14/2022      Arterial ***Venous  Blood Gas result:  pH *** pCO2 ***; pO2 ***;     %O2 Sat ***.   Recent Labs  Lab 05/14/22 1306  WBC 8.2  NEUTROABS 5.7  HGB 14.9  HCT 45.0  MCV 85.7  PLT 241    HG/HCT stable,       Component Value Date/Time   HGB 14.9 05/14/2022 1306   HCT 45.0 05/14/2022 1306   MCV 85.7 05/14/2022 1306     DM  labs:  HbA1C: No results for input(s): "HGBA1C" in the last 8760 hours.     CBG (last 3)  No results for input(s): "GLUCAP" in the last 72 hours.    Cultures:    Component Value Date/Time    SDES THROAT 06/06/2017 2239   SPECREQUEST NONE Reflexed from [id number] 06/06/2017 2239   CULT FEW STREPTOCOCCUS,BETA HEMOLYTIC NOT GROUP A 06/06/2017 2239   REPTSTATUS 06/09/2017 FINAL 06/06/2017 2239     Radiological Exams on Admission: CT Angio Chest Pulmonary Embolism (PE) W or WO Contrast  Result Date: 05/14/2022 CLINICAL DATA:  Suspected pulmonary embolism in a 31 year old male with history of shortness of breath on exertion since last evening. EXAM: CT ANGIOGRAPHY CHEST WITH CONTRAST TECHNIQUE: Multidetector CT imaging of the chest was performed using the standard protocol during bolus administration of intravenous contrast. Multiplanar CT image reconstructions and MIPs were obtained to evaluate the vascular anatomy. RADIATION DOSE REDUCTION: This exam was performed according to the departmental dose-optimization program which includes automated exposure control, adjustment of the mA and/or kV according to patient size and/or use of iterative reconstruction technique. CONTRAST:  OMNIPAQUE IOHEXOL 350 MG/ML SOLN COMPARISON:  Prior chest x-rays are available for comparison. No signs of cross-sectional imaging. FINDINGS: Cardiovascular: Heart size is normal. No pericardial effusion or nodularity. The aortic caliber is normal. No stranding adjacent to the aorta. Aorta with normal three-vessel branching pattern. Central pulmonary arteries with moderately suboptimal opacification at 190 Hounsfield units. No signs of central or lobar level pulmonary embolism. Mediastinum/Nodes: Scattered lymph nodes throughout the chest top-normal size. (Image 135/5) 14 mm RIGHT hilar lymph node. (Image 168/5) 13 mm RIGHT infrahilar lymph node. Mild fullness of LEFT hilar nodal tissue though in the 5-6 mm range. No signs of mediastinal adenopathy. No thoracic inlet adenopathy. No axillary adenopathy. Lungs/Pleura: Patchy subtle ground-glass in the RIGHT upper lobe associated with mild bronchial wall thickening. Patchy  ground-glass in the RIGHT lower lobe with multifocal nodularity. Largest area of discrete nodularity 8 x 7 mm (image 107/6) again with mild bronchial wall thickening more pronounced on the RIGHT than the LEFT and more pronounced in the RIGHT lower lobe. No pneumothorax. No sign of pleural effusion. Upper Abdomen: No acute findings relative to visualized portions of liver, gallbladder, pancreas, spleen, adrenal glands or of the kidneys. Limited imaging of upper abdominal contents in general. No upper abdominal adenopathy. Suspect hepatic steatosis with fissural widening of hepatic fissures. Musculoskeletal: No acute musculoskeletal process or destructive bone finding. Review of the MIP images confirms the above findings. IMPRESSION: 1. No signs of central or lobar level pulmonary embolism. Study limited by patient's body habitus and bolus timing. 2. Patchy subtle ground-glass in the RIGHT upper lobe associated with mild bronchial wall thickening. Patchy ground-glass in the RIGHT lower lobe with multifocal nodularity. Largest area of discrete nodularity 8 x 7 mm. Findings are favored to represent sequela of infectious bronchiolitis. Given nodular appearance and the presence of top-normal lymph nodes would suggest 8-12 week follow-up to ensure resolution 3. Mildly enlarged RIGHT hilar and infrahilar lymph nodes are likely reactive. 4. Query a P[place]ock steatosis and fissural widening of hepatic fissures, a finding that can be seen in the setting of early liver disease. Correlate clinically . Electronically Signed   By:  Donzetta KohutGeoffrey  Wile M.D.   On: 05/14/2022 15:28   DG Chest 2 View  Result Date: 05/14/2022 CLINICAL DATA:  Shortness of breath, fever, headache EXAM: CHEST - 2 VIEW COMPARISON:  Chest radiograph 04/11/2019 FINDINGS: The heart is borderline enlarged. The upper mediastinal contours are normal. There is no focal consolidation or pulmonary edema. There is no pleural effusion or pneumothorax. There is no acute  osseous abnormality. IMPRESSION: Borderline cardiomegaly. Otherwise, no radiographic evidence of acute cardiopulmonary process. Electronically Signed   By: Lesia HausenPeter  Noone M.D.   On: 05/14/2022 14:14   _______________________________________________________________________________________________________ Latest  Blood pressure (!) 161/94, pulse 86, temperature 98.9 F (37.2 C), temperature source Oral, resp. rate 20, height 6\' 3"  (1.905 m), weight (!) 150.6 kg, SpO2 94 %.   Vitals  labs and radiology finding personally reviewed  Review of Systems:    Pertinent positives include: ***  Constitutional:  No weight loss, night sweats, Fevers, chills, fatigue, weight loss  HEENT:  No headaches, Difficulty swallowing,Tooth/dental problems,Sore throat,  No sneezing, itching, ear ache, nasal congestion, post nasal drip,  Cardio-vascular:  No chest pain, Orthopnea, PND, anasarca, dizziness, palpitations.no Bilateral lower extremity swelling  GI:  No heartburn, indigestion, abdominal pain, nausea, vomiting, diarrhea, change in bowel habits, loss of appetite, melena, blood in stool, hematemesis Resp:  no shortness of breath at rest. No dyspnea on exertion, No excess mucus, no productive cough, No non-productive cough, No coughing up of blood.No change in color of mucus.No wheezing. Skin:  no rash or lesions. No jaundice GU:  no dysuria, change in color of urine, no urgency or frequency. No straining to urinate.  No flank pain.  Musculoskeletal:  No joint pain or no joint swelling. No decreased range of motion. No back pain.  Psych:  No change in mood or affect. No depression or anxiety. No memory loss.  Neuro: no localizing neurological complaints, no tingling, no weakness, no double vision, no gait abnormality, no slurred speech, no confusion  All systems reviewed and apart from HOPI all are  negative _______________________________________________________________________________________________ Past Medical History:   Past Medical History:  Diagnosis Date   Hypertension    Sleep apnea       Past Surgical History:  Procedure Laterality Date   NASAL SINUS SURGERY     NOSE SURGERY     benign tumor    Social History:  Ambulatory *** independently cane, walker  wheelchair bound, bed bound     reports that he has been smoking. He has never used smokeless tobacco. He reports current alcohol use. He reports that he does not use drugs.     Family History: *** Family History  Problem Relation Age of Onset   Hypertension Mother    Diabetes Father    ______________________________________________________________________________________________ Allergies: Allergies  Allergen Reactions   Amoxicillin      Prior to Admission medications   Medication Sig Start Date End Date Taking? Authorizing Provider  ondansetron (ZOFRAN-ODT) 8 MG disintegrating tablet Take 1 tablet (8 mg total) by mouth every 8 (eight) hours as needed for nausea or vomiting. Patient not taking: Reported on 05/14/2022 09/02/20   Wallis BambergMani, Mario, PA-C  albuterol (VENTOLIN HFA) 108 (90 Base) MCG/ACT inhaler Inhale 1-2 puffs into the lungs every 6 (six) hours as needed for wheezing or shortness of breath. 04/11/19 07/21/19  Domenick GongMortenson, Ashley, MD  amLODipine (NORVASC) 5 MG tablet Take 5 mg by mouth daily. 12/01/19 09/02/20  [provider]  fluticasone (FLONASE) 50 MCG/ACT nasal spray Place 2 sprays into  both nostrils daily. 04/11/19 07/21/19  Domenick Gong, MD    ___________________________________________________________________________________________________ Physical Exam:    05/14/2022    9:39 PM 05/14/2022    9:32 PM 05/14/2022    8:30 PM  Vitals with BMI  Height     Weight 332 lbs    BMI 41.5    Systolic  161 160  Diastolic  94 96  Pulse  86 87     1. General:  in No  Acute  distress   Chronically ill   -appearing 2. Psychological: Alert and   Oriented 3. Head/ENT: Dry Mucous Membranes                          Head Non traumatic, neck supple                          Normal   Dentition 4. SKIN: normal  Skin turgor,  Skin clean Dry and intact no rash 5. Heart: Regular rate and rhythm no  Murmur, no Rub or gallop 6. Lungs:some wheezes  crackles  distant 7. Abdomen: Soft,  non-tender, Non distended   obese  bowel sounds present 8. Lower extremities: no clubbing, cyanosis, no ***edema 9. Neurologically Grossly intact, moving all 4 extremities equally  10. MSK: Normal range of motion    Chart has been reviewed  ______________________________________________________________________________________________  Assessment/Plan  31 y.o. male with medical history significant of morbid obesity, OSA, hypertension, tobacco abuse  Admitted for   Pneumonia of right lung due to infectious organism, unspecified part of lung    Present on Admission:  Acute respiratory failure with hypoxia (HCC)  CAP (community acquired pneumonia)  OSA (obstructive sleep apnea)  Obesity, Class III, BMI 40-49.9 (morbid obesity) (HCC)  Reactive airway disease  Tobacco abuse     Acute respiratory failure with hypoxia (HCC)  this patient has acute respiratory failure with Hypoxia   as documented by the presence of following: O2 saturatio< 90% on RA   Likely due to:   Pneumonia,  Provide O2 therapy and titrate as needed  Continuous pulse ox   check Pulse ox with ambulation prior to discharge   may need  TC consult for home O2 set up    flutter valve ordered   OSA (obstructive sleep apnea) Reorder CPAP machine  CAP (community acquired pneumonia)  - -Patient presenting with  productive cough, fever    hypoxia  , and infiltrate  on chest x-ray -Infiltrate on CXR and 2-3 characteristics (fever, leukocytosis, purulent sputum) are consistent with pneumonia. -This appears to be most  likely community-acquired pneumonia.       will admit for treatment of CAP will start on appropriate antibiotic coverage. - Rocephin/azithromycin   Obtain:  sputum cultures,                              Respiratory panel significant for parainfluenza If procalcitonin not elevated at some point could consider stopping antibiotics supportive management Secondary bacterial pneumonia is still in consideration                  COVID PCR negative                    blood cultures and sputum cultures ordered                   strep  pneumo UA antigen,                 check for Legionella antigen.                Provide oxygen as needed.    Obesity, Class III, BMI 40-49.9 (morbid obesity) (HCC) Dietary adjustments will need to follow-up as an outpatient probably contributing to hypoxia  Reactive airway disease Noted wheezing no history of asthma or COPD. For tonight continue albuterol as needed And start on Solu-Medrol 40 mg IV daily  Tobacco abuse  - Spoke about importance of quitting spent 5 minutes discussing options for treatment, prior attempts at quitting, and dangers of smoking  -At this point patient is   interested in quitting  - order nicotine patch   - nursing tobacco cessation protocol    Other plan as per orders.  DVT prophylaxis:  SCD       Code Status:    Code Status: Not on file FULL CODE  as per patient   I had personally discussed CODE STATUS with patient and family    Family Communication:   Family not at  Bedside  plan of care was discussed on the phone with  Wife,   Disposition Plan:       To home once workup is complete and patient is stable   Following barriers for discharge:                                                           Afebrile, white count improving able to transition to PO antibiotics                        Transition of care consulted                    Consults called: none    Admission status:  ED Disposition     ED Disposition   Admit   Condition  --   Comment  Hospital [facility]: [facility] [[id number]]  Level of Care: Progressive [102]  Admit to Progressive based on following criteria: RESPIRATORY PROBLEMS hypoxemic/hypercapnic respiratory failure that is responsive to NIPPV (BiPAP) or High Flow Nasal Cannula (6-80 lpm). Frequent assessment/intervention, no > Q2 hrs < Q4 hrs, to maintain oxygenation and pulmonary hygiene.  May admit patient to [facility] or [facility] if equivalent level of care is available:: Yes  Interfacility transfer: Yes  Covid Evaluation: Confirmed COVID Negative  Diagnosis: Acute respiratory failure with hypoxia [facility]) [[id number]]  Admitting Physician: Rodolph Bong [[id number]]  Attending Physician: Glyn Ade [[id number]]  Certification:: I certify this patient will need inpatient services for at least 2 midnights  Estimated Length of Stay: 4          inpatient     I Expect 2 midnight stay secondary to severity of patient's current illness need for inpatient interventions justified by the following:  hemodynamic instability despite optimal treatment (tachycardia  hypoxia,  )   Severe lab/radiological/exam abnormalities including:    CAP and extensive comorbidities including:  Morbid Obesity    That are currently affecting medical management.   I expect  patient to be hospitalized for 2 midnights requiring inpatient medical care.  Patient is at high risk for adverse  outcome (such as loss of life or disability) if not treated.  Indication for inpatient stay as follows:    New or worsening hypoxia   Need for IV antibiotics, IV fluids,    Level of care     progressive tele indefinitely please discontinue once patient no longer qualifies COVID-19 Labs     Lab Results  Component Value Date   SARSCOV2NAA NEGATIVE 05/14/2022     Precautions: admitted as   Covid Negative   Wesley Wheeler 05/14/2022, 11:22 PM    [facility]     after 2 AM  please page floor coverage PA If 7AM-7PM, please contact the day team taking care of the patient using [email or web address]   Patient was evaluated in the context of the global COVID-19 pandemic, which necessitated consideration that the patient might be at risk for infection with the SARS-CoV-2 virus that causes COVID-19. Institutional protocols and algorithms that pertain to the evaluation of patients at risk for COVID-19 are in a state of rapid change based on information released by regulatory bodies including the CDC and federal and state organizations. These policies and algorithms were followed during the patient's care.

## 2022-05-14 NOTE — ED Triage Notes (Addendum)
Pt c/o SOB, cough, fever, and HA since last night. States SOB on exertion. Pts O2 86 on arrival, after sitting O2 91%. Pt speaking in complete sentence. Pt state can breath better when bending over. States had a neg home COVID test today.

## 2022-05-14 NOTE — Assessment & Plan Note (Signed)
-   Spoke about importance of quitting spent 5 minutes discussing options for treatment, prior attempts at quitting, and dangers of smoking ? -At this point patient is    interested in quitting ? - order nicotine patch  ? - nursing tobacco cessation protocol ? ?

## 2022-05-14 NOTE — Assessment & Plan Note (Signed)
Dietary adjustments will need to follow-up as an outpatient probably contributing to hypoxia

## 2022-05-14 NOTE — Assessment & Plan Note (Signed)
Noted wheezing no history of asthma or COPD. For tonight continue albuterol as needed And start on Solu-Medrol 40 mg IV daily

## 2022-05-14 NOTE — Assessment & Plan Note (Signed)
this patient has acute respiratory failure with Hypoxia as documented by the presence of following: O2 saturatio< 90% on RA   Likely due to:   Pneumonia,   Provide O2 therapy and titrate as needed  Continuous pulse ox   check Pulse ox with ambulation prior to discharge   may need  TC consult for home O2 set up    flutter valve ordered   

## 2022-05-14 NOTE — ED Notes (Signed)
Patient is being discharged from the Urgent Care and sent to the Emergency Department via POV . Per Rebecca,PA, patient is in need of higher level of care due to SOB. Patient is aware and verbalizes understanding of plan of care.  Vitals:   05/14/22 1158  BP: (!) 188/97  Pulse: 97  Resp: 20  Temp: 98.7 F (37.1 C)  SpO2: 91%

## 2022-05-14 NOTE — Plan of Care (Signed)
The patient is admitted form [facility] to [facility] 4 W. A & O x 4. The patient is oriented to staff, call bell and ascom. He voiced no concern at this time. Full assessment to epic completed. Will continue to monitor.

## 2022-05-14 NOTE — Subjective & Objective (Signed)
Presents with shortness of breath cough and fever and headache the past 24 hours shortness of breath with exertion on arrival satting 86% on room air tested negative for COVID at home And she was seen in urgent care and sent to emergency department Patient has morbid obesity and OSA at baseline and hypertension but he does not take any medications Otherwise healthy does endorse tobacco abuse Has been having some wheezing

## 2022-05-14 NOTE — Care Plan (Signed)
Was called by ED, patient is a pleasant 31 year old gentleman with extensive tobacco history, presenting with a 24-hour history of fevers, shortness of breath, fatigue, cough.  Patient with no significant past medical history.  Patient seen in the ED initially by EDP noted to have sats in the 80s on room air on presentation, temp of 100.9, slight tachycardia heart rate 103, tachypnea respiratory rate 28.  Workup including CT angiogram chest concerning for multifocal pneumonia versus bronchiolitis.  CT angiogram chest negative for PE.  COVID-19 PCR negative.  Patient placed on IV Rocephin, IV azithromycin.  Hospitalist called for admission.  Patient admitted to progressive care bed due to presentation of acute respiratory failure with hypoxia.  No charge.

## 2022-05-14 NOTE — Assessment & Plan Note (Signed)
Reorder CPAP machine

## 2022-05-14 NOTE — ED Notes (Signed)
Called and no answer.

## 2022-05-14 NOTE — Assessment & Plan Note (Signed)
- -  Patient presenting with  productive cough, fever    hypoxia  , and infiltrate  on chest x-ray -Infiltrate on CXR and 2-3 characteristics (fever, leukocytosis, purulent sputum) are consistent with pneumonia. -This appears to be most likely community-acquired pneumonia.       will admit for treatment of CAP will start on appropriate antibiotic coverage. - Rocephin/azithromycin   Obtain:  sputum cultures,                              Respiratory panel significant for parainfluenza If procalcitonin not elevated at some point could consider stopping antibiotics supportive management Secondary bacterial pneumonia is still in consideration                  COVID PCR negative                    blood cultures and sputum cultures ordered                   strep pneumo UA antigen,                 check for Legionella antigen.                Provide oxygen as needed.

## 2022-05-14 NOTE — ED Triage Notes (Signed)
Pt arrived POV after leaving UC. Pt reports onset of SOB, fever, HA, increased SOB on exertion since last night. Pt tachypneic with hypoxia in triage with SpO2 86% RA. 2L O2 increased SpO2% to 92-93, O2 increased to 3L which brought SpO2 to 94%.   Pt states he took home COVID test yesterday which was neg. Pt febrile in triage.

## 2022-05-14 NOTE — ED Provider Notes (Signed)
[facility] EMERGENCY DEPT Provider Note   CSN: [id number] Arrival date & time: 05/14/22  1240     History Chief Complaint  Patient presents with   Shortness of Breath    HPI Wesley Wheeler is a 31 y.o. male presenting for chief complaint of shortness of breath.  He is otherwise healthy, over the past 24 hours he started having fevers, shortness of breath, fatigue, cough.  Prior to yesterday, he was completely asymptomatic.  He has a 40-pack-year smoking history.   Patient's recorded medical, surgical, social, medication list and allergies were reviewed in the Snapshot window as part of the initial history.   Review of Systems   Review of Systems  Constitutional:  Positive for fever. Negative for chills.  HENT:  Negative for ear pain and sore throat.   Eyes:  Negative for pain and visual disturbance.  Respiratory:  Positive for shortness of breath. Negative for cough.   Cardiovascular:  Negative for chest pain and palpitations.  Gastrointestinal:  Negative for abdominal pain and vomiting.  Genitourinary:  Negative for dysuria and hematuria.  Musculoskeletal:  Negative for arthralgias and back pain.  Skin:  Negative for color change and rash.  Neurological:  Negative for seizures and syncope.  All other systems reviewed and are negative.   Physical Exam Updated Vital Signs BP (!) 142/90   Pulse 97   Temp (!) 100.9 F (38.3 C) (Oral)   Resp (!) 25   SpO2 (!) 89%  Physical Exam Vitals and nursing note reviewed.  Constitutional:      General: He is not in acute distress.    Appearance: He is well-developed.  HENT:     Head: Normocephalic and atraumatic.  Eyes:     Conjunctiva/sclera: Conjunctivae normal.  Cardiovascular:     Rate and Rhythm: Normal rate and regular rhythm.     Heart sounds: No murmur heard. Pulmonary:     Effort: Pulmonary effort is normal. Tachypnea present. No respiratory distress.     Breath sounds: Wheezing and rhonchi present.   Abdominal:     Palpations: Abdomen is soft.     Tenderness: There is no abdominal tenderness.  Musculoskeletal:        General: No swelling.     Cervical back: Neck supple.  Skin:    General: Skin is warm and dry.     Capillary Refill: Capillary refill takes less than 2 seconds.  Neurological:     Mental Status: He is alert.  Psychiatric:        Mood and Affect: Mood normal.      ED Course/ Medical Decision Making/ A&P    Procedures Procedures   Medications Ordered in ED Medications  albuterol (PROVENTIL) (2.5 MG/3ML) 0.083% nebulizer solution (  Not Given 05/14/22 1318)  cefTRIAXone (ROCEPHIN) 1 g in sodium chloride 0.9 % 100 mL IVPB (has no administration in time range)  azithromycin (ZITHROMAX) 500 mg in sodium chloride 0.9 % 250 mL IVPB (has no administration in time range)  acetaminophen (TYLENOL) tablet 1,000 mg (has no administration in time range)  lactated ringers bolus 1,000 mL (has no administration in time range)  lactated ringers bolus 1,000 mL (has no administration in time range)  albuterol (VENTOLIN HFA) 108 (90 Base) MCG/ACT inhaler (  Given 05/14/22 1258)  albuterol (PROVENTIL) (2.5 MG/3ML) 0.083% nebulizer solution 5 mg (5 mg Nebulization Given 05/14/22 1317)  iohexol (OMNIPAQUE) 350 MG/ML injection 100 mL (100 mLs Intravenous Contrast Given 05/14/22 1503)    Medical  Decision Making:    Wesley Wheeler is a 31 y.o. male who presented to the ED today with shortness of breath detailed above.     Patient's presentation is complicated by their history of elevated BMI.  Patient placed on continuous vitals and telemetry monitoring while in ED which was reviewed periodically.   Complete initial physical exam performed, notably the patient  was with diffuse rhonchi.  He is febrile and tachycardic as well as tachypneic.  He is on 4 L nasal cannula.      Reviewed and confirmed nursing documentation for past medical history, family history, social history.     Initial Assessment:   With the patient's presentation of fever, cough, congestion, tachycardia, most likely diagnosis is sepsis likely secondary to pulmonary etiology. Other diagnoses were considered including (but not limited to) pulmonary embolism, ACS, aortic dissection. These are considered less likely due to history of present illness and physical exam findings.   This is most consistent with an acute life/limb threatening illness complicated by underlying chronic conditions.  Initial Plan:  CTA PE study to evaluate for intrathoracic vascular or pulmonary abnormality Screening labs including CBC and Metabolic panel to evaluate for infectious or metabolic etiology of disease.  Viral screening CXR to evaluate for structural/infectious intrathoracic pathology.  Objective evaluation as below reviewed with plan for close reassessment  Initial Study Results:   Laboratory  All laboratory results reviewed without evidence of clinically relevant pathology.    Radiology  All images reviewed independently. Agree with radiology report at this time.   CT Angio Chest Pulmonary Embolism (PE) W or WO Contrast  Result Date: 05/14/2022 CLINICAL DATA:  Suspected pulmonary embolism in a 31 year old male with history of shortness of breath on exertion since last evening. EXAM: CT ANGIOGRAPHY CHEST WITH CONTRAST TECHNIQUE: Multidetector CT imaging of the chest was performed using the standard protocol during bolus administration of intravenous contrast. Multiplanar CT image reconstructions and MIPs were obtained to evaluate the vascular anatomy. RADIATION DOSE REDUCTION: This exam was performed according to the departmental dose-optimization program which includes automated exposure control, adjustment of the mA and/or kV according to patient size and/or use of iterative reconstruction technique. CONTRAST:  100mL OMNIPAQUE IOHEXOL 350 MG/ML SOLN COMPARISON:  Prior chest x-rays are available for comparison. No  signs of cross-sectional imaging. FINDINGS: Cardiovascular: Heart size is normal. No pericardial effusion or nodularity. The aortic caliber is normal. No stranding adjacent to the aorta. Aorta with normal three-vessel branching pattern. Central pulmonary arteries with moderately suboptimal opacification at 190 Hounsfield units. No signs of central or lobar level pulmonary embolism. Mediastinum/Nodes: Scattered lymph nodes throughout the chest top-normal size. (Image 135/5) 14 mm RIGHT hilar lymph node. (Image 168/5) 13 mm RIGHT infrahilar lymph node. Mild fullness of LEFT hilar nodal tissue though in the 5-6 mm range. No signs of mediastinal adenopathy. No thoracic inlet adenopathy. No axillary adenopathy. Lungs/Pleura: Patchy subtle ground-glass in the RIGHT upper lobe associated with mild bronchial wall thickening. Patchy ground-glass in the RIGHT lower lobe with multifocal nodularity. Largest area of discrete nodularity 8 x 7 mm (image 107/6) again with mild bronchial wall thickening more pronounced on the RIGHT than the LEFT and more pronounced in the RIGHT lower lobe. No pneumothorax. No sign of pleural effusion. Upper Abdomen: No acute findings relative to visualized portions of liver, gallbladder, pancreas, spleen, adrenal glands or of the kidneys. Limited imaging of upper abdominal contents in general. No upper abdominal adenopathy. Suspect hepatic steatosis with fissural widening of hepatic  fissures. Musculoskeletal: No acute musculoskeletal process or destructive bone finding. Review of the MIP images confirms the above findings. IMPRESSION: 1. No signs of central or lobar level pulmonary embolism. Study limited by patient's body habitus and bolus timing. 2. Patchy subtle ground-glass in the RIGHT upper lobe associated with mild bronchial wall thickening. Patchy ground-glass in the RIGHT lower lobe with multifocal nodularity. Largest area of discrete nodularity 8 x 7 mm. Findings are favored to represent  sequela of infectious bronchiolitis. Given nodular appearance and the presence of top-normal lymph nodes would suggest 8-12 week follow-up to ensure resolution 3. Mildly enlarged RIGHT hilar and infrahilar lymph nodes are likely reactive. 4. Query a P[place]ock steatosis and fissural widening of hepatic fissures, a finding that can be seen in the setting of early liver disease. Correlate clinically . Electronically Signed   By: Donzetta Kohut M.D.   On: 05/14/2022 15:28   DG Chest 2 View  Result Date: 05/14/2022 CLINICAL DATA:  Shortness of breath, fever, headache EXAM: CHEST - 2 VIEW COMPARISON:  Chest radiograph 04/11/2019 FINDINGS: The heart is borderline enlarged. The upper mediastinal contours are normal. There is no focal consolidation or pulmonary edema. There is no pleural effusion or pneumothorax. There is no acute osseous abnormality. IMPRESSION: Borderline cardiomegaly. Otherwise, no radiographic evidence of acute cardiopulmonary process. Electronically Signed   By: Lesia Hausen M.D.   On: 05/14/2022 14:14      Final Assessment and Plan:   Patient's objective findings raise concern for a right lobar opacity.  I am more concerned for multilobar pneumonia given rapid onset, developing fever, shortness of breath, hypoxia.  Fortunately no evidence of acute thromboembolic disease.   Patient started on sepsis protocol including IV fluids, IV antibiotics including ceftriaxone, azithromycin and arranged for admission for further care and management.  Disposition:   Based on the above findings, I believe this patient is stable for admission.    Patient/family educated about specific findings on our evaluation and explained exact reasons for admission.  Patient/family educated about clinical situation and time was allowed to answer questions.   Admission team communicated with and agreed with need for admission. Patient admitted. Patient  ready to move at this time.     Emergency Department  Medication Summary:   Medications  albuterol (PROVENTIL) (2.5 MG/3ML) 0.083% nebulizer solution (  Not Given 05/14/22 1318)  cefTRIAXone (ROCEPHIN) 1 g in sodium chloride 0.9 % 100 mL IVPB (has no administration in time range)  azithromycin (ZITHROMAX) 500 mg in sodium chloride 0.9 % 250 mL IVPB (has no administration in time range)  acetaminophen (TYLENOL) tablet 1,000 mg (has no administration in time range)  lactated ringers bolus 1,000 mL (has no administration in time range)  lactated ringers bolus 1,000 mL (has no administration in time range)  albuterol (VENTOLIN HFA) 108 (90 Base) MCG/ACT inhaler (  Given 05/14/22 1258)  albuterol (PROVENTIL) (2.5 MG/3ML) 0.083% nebulizer solution 5 mg (5 mg Nebulization Given 05/14/22 1317)  iohexol (OMNIPAQUE) 350 MG/ML injection 100 mL (100 mLs Intravenous Contrast Given 05/14/22 1503)         Clinical Impression:  1. Pneumonia of right lung due to infectious organism, unspecified part of lung      Admit   Final Clinical Impression(s) / ED Diagnoses Final diagnoses:  Pneumonia of right lung due to infectious organism, unspecified part of lung    Rx / DC Orders ED Discharge Orders     None  Glyn Ade, MD 05/14/22 1537

## 2022-05-15 ENCOUNTER — Encounter (HOSPITAL_COMMUNITY): Payer: Self-pay | Admitting: Internal Medicine

## 2022-05-15 LAB — MRSA NEXT GEN BY PCR, NASAL: MRSA by PCR Next Gen: NOT DETECTED

## 2022-05-15 LAB — COMPREHENSIVE METABOLIC PANEL
ALT: 49 U/L — ABNORMAL HIGH (ref 0–44)
AST: 36 U/L (ref 15–41)
Albumin: 3.8 g/dL (ref 3.5–5.0)
Alkaline Phosphatase: 42 U/L (ref 38–126)
Anion gap: 7 (ref 5–15)
BUN: 9 mg/dL (ref 6–20)
CO2: 28 mmol/L (ref 22–32)
Calcium: 8.9 mg/dL (ref 8.9–10.3)
Chloride: 103 mmol/L (ref 98–111)
Creatinine, Ser: 0.6 mg/dL — ABNORMAL LOW (ref 0.61–1.24)
GFR, Estimated: >60 mL/min (ref >60)
Glucose, Bld: 121 mg/dL — ABNORMAL HIGH (ref 70–99)
Potassium: 4.3 mmol/L (ref 3.5–5.1)
Sodium: 138 mmol/L (ref 135–145)
Total Bilirubin: 0.9 mg/dL (ref 0.3–1.2)
Total Protein: 8.2 g/dL — ABNORMAL HIGH (ref 6.5–8.1)

## 2022-05-15 LAB — TSH: TSH: 0.85 u[IU]/mL (ref 0.350–4.500)

## 2022-05-15 LAB — CBC
HCT: 42.8 % (ref 39.0–52.0)
Hemoglobin: 13.7 g/dL (ref 13.0–17.0)
MCH: 27.8 pg (ref 26.0–34.0)
MCHC: 32 g/dL (ref 30.0–36.0)
MCV: 86.8 fL (ref 80.0–100.0)
Platelets: 211 10*3/uL (ref 150–400)
RBC: 4.93 MIL/uL (ref 4.22–5.81)
RDW: 13.8 % (ref 11.5–15.5)
WBC: 10.2 10*3/uL (ref 4.0–10.5)
nRBC: 0 % (ref 0.0–0.2)

## 2022-05-15 LAB — STREP PNEUMONIAE URINARY ANTIGEN: Strep Pneumo Urinary Antigen: NEGATIVE

## 2022-05-15 LAB — PROCALCITONIN
Procalcitonin: <0.1 ng/mL
Procalcitonin: <0.1 ng/mL

## 2022-05-15 LAB — EXPECTORATED SPUTUM ASSESSMENT W GRAM STAIN, RFLX TO RESP C: Special Requests: NORMAL

## 2022-05-15 LAB — HIV ANTIBODY (ROUTINE TESTING W REFLEX): HIV Screen 4th Generation wRfx: NONREACTIVE

## 2022-05-15 LAB — MAGNESIUM: Magnesium: 2.4 mg/dL (ref 1.7–2.4)

## 2022-05-15 LAB — PHOSPHORUS: Phosphorus: 3 mg/dL (ref 2.5–4.6)

## 2022-05-15 MED ORDER — IPRATROPIUM-ALBUTEROL 0.5-2.5 (3) MG/3ML IN SOLN: 3.0000 mL | Freq: Four times a day (QID) | RESPIRATORY_TRACT | Status: DC | PRN

## 2022-05-15 MED ORDER — METHYLPREDNISOLONE SODIUM SUCC 40 MG IJ SOLR
40.0000 mg | INTRAMUSCULAR | Status: DC
  Administered 2022-05-15 – 2022-05-16 (×2): 40 mg via INTRAVENOUS
  Filled 2022-05-15: qty 1

## 2022-05-15 MED ORDER — IPRATROPIUM-ALBUTEROL 0.5-2.5 (3) MG/3ML IN SOLN
3.0000 mL | Freq: Three times a day (TID) | RESPIRATORY_TRACT | Status: DC
  Administered 2022-05-15 – 2022-05-16 (×4): 3 mL via RESPIRATORY_TRACT
  Filled 2022-05-15 (×4): qty 3

## 2022-05-15 NOTE — Progress Notes (Signed)
Mobility Specialist - Progress Note   05/15/22 1543  Mobility  Activity Ambulated independently in hallway  Level of Assistance Independent  Assistive Device None  Distance Ambulated (ft) 350 ft  Activity Response Tolerated well  Mobility Referral Yes  $Mobility charge 1 Mobility   Pt received in room standing and agreeable to mobility. No complaints during mobility. Pt to room standing after session with all needs met.      [facility]

## 2022-05-15 NOTE — Progress Notes (Signed)
Mobility Specialist - Progress Note   05/15/22 1230  Oxygen Therapy  O2 Device Nasal Cannula  O2 Flow Rate (L/min) 2 L/min  Mobility  Activity Ambulated with assistance in hallway  Level of Assistance Independent  Assistive Device  (IV Pole)  Distance Ambulated (ft) 350 ft  Activity Response Tolerated well  Mobility Referral Yes  $Mobility charge 1 Mobility   Pt received in room standing and agreeable to mobility. No complaints during mobility. Pt to room standing after session with all needs met & wife in room.  Pre-mobility: 95bpm HR, 91% SpO2 During mobility: 105bpm HR, 90% SpO2 Post-mobility: 95bpm HR, 92% SPO2  Set designer

## 2022-05-15 NOTE — Progress Notes (Signed)
[facility]                                                                               Wesley Wheeler, is a 31 y.o. male, DOB - 27-Nov-1990, GGY:[id number] Admit date - 05/14/2022    Outpatient Primary MD for the patient is Patient, No Pcp Per  LOS - 1  days    Brief summary    Wesley Wheeler is a 31 y.o. male with medical history significant of morbid obesity, OSA, hypertension, tobacco abuse , Presented with   fever shortness of breath. Her respiratory panel shows parainfluenza virus positive.   Assessment & Plan    Assessment and Plan:   Acute respite failure with hypoxia probably secondary to pneumonia Patient was started on IV antibiotics would continue the same for another 24 hours.  Symptomatic management with cough medication. Strep pneumonia urinary antigen is pending Follow-up blood cultures and sputum cultures. COVID PCR is negative respiratory panel significant for parainfluenza virus. Continue with steroids.    Obesity  Body mass index is 41.5 kg/m.    Tobacco abuse:  - on nicotine patch.   OSA  On CPAP.    Estimated body mass index is 41.5 kg/m as calculated from the following:   Height as of this encounter: 6\' 3"  (1.905 m).   Weight as of this encounter: 150.6 kg.  Code Status: full code.  DVT Prophylaxis:  scd    Level of Care: Level of care: Progressive Family Communication: Updated patient's family at bedside.   Disposition Plan:     Remains inpatient appropriate:  IV antibiotics.   Procedures:  None.   Consultants:   None.   Antimicrobials:   Anti-infectives (From admission, onward)    Start     Dose/Rate Route Frequency Ordered Stop   05/15/22 1500  cefTRIAXone (ROCEPHIN) 2 g in sodium chloride 0.9 % 100 mL IVPB        2 g 200 mL/hr over 30 Minutes Intravenous Every 24 hours 05/14/22 2206 05/20/22 1459   05/15/22 1500  azithromycin (ZITHROMAX) 500 mg in sodium chloride 0.9 % 250 mL IVPB        500 mg 250  mL/hr over 60 Minutes Intravenous Every 24 hours 05/14/22 2206 05/20/22 1459   05/15/22 1200  cefTRIAXone (ROCEPHIN) 1 g in sodium chloride 0.9 % 100 mL IVPB  Status:  Discontinued        1 g 200 mL/hr over 30 Minutes Intravenous Every 24 hours 05/14/22 1555 05/14/22 2208   05/15/22 1200  azithromycin (ZITHROMAX) 500 mg in sodium chloride 0.9 % 250 mL IVPB  Status:  Discontinued        500 mg 250 mL/hr over 60 Minutes Intravenous Every 24 hours 05/14/22 1555 05/14/22 2208   05/14/22 1430  cefTRIAXone (ROCEPHIN) 1 g in sodium chloride 0.9 % 100 mL IVPB        1 g 200 mL/hr over 30 Minutes Intravenous  Once 05/14/22 1425 05/14/22 1912   05/14/22 1430  azithromycin (ZITHROMAX) 500 mg in sodium chloride 0.9 % 250 mL IVPB        500 mg 250 mL/hr over 60 Minutes Intravenous  Once 05/14/22 1425 05/14/22 1912        Medications  Scheduled Meds:  enoxaparin (LOVENOX) injection  75 mg Subcutaneous Q24H   guaiFENesin  600 mg Oral BID   ipratropium-albuterol  3 mL Nebulization TID   methylPREDNISolone (SOLU-MEDROL) injection  40 mg Intravenous Q24H   nicotine  14 mg Transdermal Daily   Continuous Infusions:  azithromycin     cefTRIAXone (ROCEPHIN)  IV     PRN Meds:.acetaminophen **OR** acetaminophen, albuterol, HYDROcodone-acetaminophen, ipratropium-albuterol    Subjective:   Wesley Wheeler was seen and examined today.  Breathing is improving, fever yesterday, still coughing.  Objective:   Vitals:   05/15/22 0503 05/15/22 0702 05/15/22 0832 05/15/22 1001  BP:    (!) 149/90  Pulse:    87  Resp:    18  Temp:    98.5 F (36.9 C)  TempSrc:    Oral  SpO2: (!) 89% 94% 91% 94%  Weight:      Height:        Intake/Output Summary (Last 24 hours) at 05/15/2022 1055 Last data filed at 05/15/2022 0700 Gross per 24 hour  Intake 625.64 ml  Output --  Net 625.64 ml   Filed Weights   05/14/22 2139  Weight: (!) 150.6 kg     Exam General: Alert and oriented x 3,  NAD Cardiovascular: S1 S2 auscultated, no murmurs, RRR Respiratory: Clear to auscultation bilaterally, no wheezing, rales or rhonchi Gastrointestinal: Soft, nontender, nondistended, + bowel sounds Ext: no pedal edema bilaterally Neuro: AAOx3, Cr N's II- XII. Strength 5/5 upper and lower extremities bilaterally Skin: No rashes Psych: Normal affect and demeanor, alert and oriented x3    Data Reviewed:  I have personally reviewed following labs and imaging studies   CBC Lab Results  Component Value Date   WBC 10.2 05/15/2022   RBC 4.93 05/15/2022   HGB 13.7 05/15/2022   HCT 42.8 05/15/2022   MCV 86.8 05/15/2022   MCH 27.8 05/15/2022   PLT 211 05/15/2022   MCHC 32.0 05/15/2022   RDW 13.8 05/15/2022   LYMPHSABS 1.4 05/14/2022   MONOABS 1.0 05/14/2022   EOSABS 0.1 05/14/2022   BASOSABS 0.0 05/14/2022     Last metabolic panel Lab Results  Component Value Date   NA 138 05/15/2022   K 4.3 05/15/2022   CL 103 05/15/2022   CO2 28 05/15/2022   BUN 9 05/15/2022   CREATININE 0.60 (L) 05/15/2022   GLUCOSE 121 (H) 05/15/2022   GFRNONAA >60 05/15/2022   CALCIUM 8.9 05/15/2022   PHOS 3.0 05/15/2022   PROT 8.2 (H) 05/15/2022   ALBUMIN 3.8 05/15/2022   BILITOT 0.9 05/15/2022   ALKPHOS 42 05/15/2022   AST 36 05/15/2022   ALT 49 (H) 05/15/2022   ANIONGAP 7 05/15/2022    CBG (last 3)  No results for input(s): "GLUCAP" in the last 72 hours.    Coagulation Profile: Recent Labs  Lab 05/14/22 1306  INR 1.0     Radiology Studies: CT Angio Chest Pulmonary Embolism (PE) W or WO Contrast  Result Date: 05/14/2022 CLINICAL DATA:  Suspected pulmonary embolism in a 31 year old male with history of shortness of breath on exertion since last evening. EXAM: CT ANGIOGRAPHY CHEST WITH CONTRAST TECHNIQUE: Multidetector CT imaging of the chest was performed using the standard protocol during bolus administration of intravenous contrast. Multiplanar CT image reconstructions and MIPs were  obtained to evaluate the vascular anatomy. RADIATION DOSE REDUCTION: This exam was performed according to the departmental dose-optimization program  which includes automated exposure control, adjustment of the mA and/or kV according to patient size and/or use of iterative reconstruction technique. CONTRAST:  OMNIPAQUE IOHEXOL 350 MG/ML SOLN COMPARISON:  Prior chest x-rays are available for comparison. No signs of cross-sectional imaging. FINDINGS: Cardiovascular: Heart size is normal. No pericardial effusion or nodularity. The aortic caliber is normal. No stranding adjacent to the aorta. Aorta with normal three-vessel branching pattern. Central pulmonary arteries with moderately suboptimal opacification at 190 Hounsfield units. No signs of central or lobar level pulmonary embolism. Mediastinum/Nodes: Scattered lymph nodes throughout the chest top-normal size. (Image 135/5) 14 mm RIGHT hilar lymph node. (Image 168/5) 13 mm RIGHT infrahilar lymph node. Mild fullness of LEFT hilar nodal tissue though in the 5-6 mm range. No signs of mediastinal adenopathy. No thoracic inlet adenopathy. No axillary adenopathy. Lungs/Pleura: Patchy subtle ground-glass in the RIGHT upper lobe associated with mild bronchial wall thickening. Patchy ground-glass in the RIGHT lower lobe with multifocal nodularity. Largest area of discrete nodularity 8 x 7 mm (image 107/6) again with mild bronchial wall thickening more pronounced on the RIGHT than the LEFT and more pronounced in the RIGHT lower lobe. No pneumothorax. No sign of pleural effusion. Upper Abdomen: No acute findings relative to visualized portions of liver, gallbladder, pancreas, spleen, adrenal glands or of the kidneys. Limited imaging of upper abdominal contents in general. No upper abdominal adenopathy. Suspect hepatic steatosis with fissural widening of hepatic fissures. Musculoskeletal: No acute musculoskeletal process or destructive bone finding. Review of the MIP  images confirms the above findings. IMPRESSION: 1. No signs of central or lobar level pulmonary embolism. Study limited by patient's body habitus and bolus timing. 2. Patchy subtle ground-glass in the RIGHT upper lobe associated with mild bronchial wall thickening. Patchy ground-glass in the RIGHT lower lobe with multifocal nodularity. Largest area of discrete nodularity 8 x 7 mm. Findings are favored to represent sequela of infectious bronchiolitis. Given nodular appearance and the presence of top-normal lymph nodes would suggest 8-12 week follow-up to ensure resolution 3. Mildly enlarged RIGHT hilar and infrahilar lymph nodes are likely reactive. 4. Query a [place]ock steatosis and fissural widening of hepatic fissures, a finding that can be seen in the setting of early liver disease. Correlate clinically . Electronically Signed   By: Donzetta Kohut M.D.   On: 05/14/2022 15:28   DG Chest 2 View  Result Date: 05/14/2022 CLINICAL DATA:  Shortness of breath, fever, headache EXAM: CHEST - 2 VIEW COMPARISON:  Chest radiograph 04/11/2019 FINDINGS: The heart is borderline enlarged. The upper mediastinal contours are normal. There is no focal consolidation or pulmonary edema. There is no pleural effusion or pneumothorax. There is no acute osseous abnormality. IMPRESSION: Borderline cardiomegaly. Otherwise, no radiographic evidence of acute cardiopulmonary process. Electronically Signed   By: Lesia Hausen M.D.   On: 05/14/2022 14:14       Kathlen Mody M.D. [facility] 05/15/2022, 10:55 AM  Available via Epic secure chat 7am-7pm After 7 pm, please refer to night coverage provider listed on amion.

## 2022-05-15 NOTE — TOC Initial Note (Signed)
Transition of Care [facility]) - Initial/Assessment Note    Patient Details  Name: Wesley Wheeler MRN: [id number] Date of Birth: 03-Apr-1991  [facility] CM/SW Contact:    Golda Acre, RN Phone Number: 05/15/2022, 9:22 AM  Clinical Narrative:                 Resources for smoking will be added to the AVS.  Pt wt  is 332lbs.  Will need information on weight loss.  Expected Discharge Plan: Home/Self Care Barriers to Discharge: Continued Medical Work up   Patient Goals and CMS Choice Patient states their goals for this hospitalization and ongoing recovery are:: to go home CMS Medicare.gov Compare Post Acute Care list provided to:: Patient    Expected Discharge Plan and Services Expected Discharge Plan: Home/Self Care   Discharge Planning Services: CM Consult   Living arrangements for the past 2 months: Mobile Home                                      Prior Living Arrangements/Services Living arrangements for the past 2 months: Mobile Home Lives with:: Spouse Patient language and need for interpreter reviewed:: Yes Do you feel safe going back to the place where you live?: Yes            Criminal Activity/Legal Involvement Pertinent to Current Situation/Hospitalization: No - Comment as needed  Activities of Daily Living Home Assistive Devices/Equipment: CPAP ADL Screening (condition at time of admission) Patient's cognitive ability adequate to safely complete daily activities?: Yes Is the patient deaf or have difficulty hearing?: No Does the patient have difficulty seeing, even when wearing glasses/contacts?: No Does the patient have difficulty concentrating, remembering, or making decisions?: No Patient able to express need for assistance with ADLs?: Yes Does the patient have difficulty dressing or bathing?: No Independently performs ADLs?: Yes (appropriate for developmental age) Does the patient have difficulty walking or climbing stairs?:  No Weakness of Legs: None Weakness of Arms/Hands: None  Permission Sought/Granted                  Emotional Assessment Appearance:: Appears stated age Attitude/Demeanor/Rapport: Engaged Affect (typically observed): Calm Orientation: : Oriented to Self, Oriented to Place, Oriented to  Time, Oriented to Situation Alcohol / Substance Use: Tobacco Use, Alcohol Use (current smoker and moderate etoh use.) Psych Involvement: No (comment)  Admission diagnosis:  Acute respiratory failure with hypoxia (HCC) [J96.01] Pneumonia of right lung due to infectious organism, unspecified part of lung [J18.9] Patient Active Problem List   Diagnosis Date Noted   Acute respiratory failure with hypoxia (HCC) 05/14/2022   CAP (community acquired pneumonia) 05/14/2022   OSA (obstructive sleep apnea) 05/14/2022   Obesity, Class III, BMI 40-49.9 (morbid obesity) (HCC) 05/14/2022   Reactive airway disease 05/14/2022   Tobacco abuse 05/14/2022   PCP:  Patient, No Pcp Per Pharmacy:   [id number] [place], [place] - [address] AT [facility] [address] [place] [address] Phone: [phone] Fax: [phone]     Social Determinants of Health (SDOH) Interventions Housing Interventions: Intervention Not Indicated  Readmission Risk Interventions   No data to display

## 2022-05-15 NOTE — Plan of Care (Signed)
  Problem: Education: Goal: Knowledge of General Education information will improve Description Including pain rating scale, medication(s)/side effects and non-pharmacologic comfort measures Outcome: Progressing   Problem: Health Behavior/Discharge Planning: Goal: Ability to manage health-related needs will improve Outcome: Progressing   

## 2022-05-16 LAB — PROCALCITONIN: Procalcitonin: <0.1 ng/mL

## 2022-05-16 LAB — LEGIONELLA PNEUMOPHILA SEROGP 1 UR AG: L. pneumophila Serogp 1 Ur Ag: NEGATIVE

## 2022-05-16 MED ORDER — GUAIFENESIN-DM 100-10 MG/5ML PO SYRP: 10.0000 mL | ORAL_SOLUTION | ORAL | 0 refills | Status: AC | PRN

## 2022-05-16 MED ORDER — DOXYCYCLINE MONOHYDRATE 100 MG PO TABS: 100.0000 mg | ORAL_TABLET | Freq: Two times a day (BID) | ORAL | 0 refills | Status: AC

## 2022-05-16 MED ORDER — NICOTINE 14 MG/24HR TD PT24: 14.0000 mg | MEDICATED_PATCH | Freq: Every day | TRANSDERMAL | 0 refills | Status: AC

## 2022-05-16 MED ORDER — PREDNISONE 10 MG PO TABS: 40.0000 mg | ORAL_TABLET | Freq: Every day | ORAL | 0 refills | Status: AC

## 2022-05-16 NOTE — Discharge Summary (Signed)
Physician Discharge Summary   Patient: Wesley Wheeler MRN: [id number] DOB: 28-Jan-1991  Admit date:     05/14/2022  Discharge date: 05/16/2022  Discharge Physician: Hosie Poisson   PCP: Patient, No Pcp Per   Recommendations at discharge:  Please follow up with PCP in one week.  Please follow up with toc for CPAP machine.   Discharge Diagnoses: Principal Problem:   Acute respiratory failure with hypoxia ([place]) Active Problems:   CAP (community acquired pneumonia)   OSA (obstructive sleep apnea)   Obesity, Class III, BMI 40-49.9 (morbid obesity) ([place])   Reactive airway disease   Tobacco abuse    Hospital Course:  Wesley Wheeler is a 31 y.o. male with medical history significant of morbid obesity, OSA, hypertension, tobacco abuse , Presented with   fever shortness of breath. Her respiratory panel shows parainfluenza virus positive.   Assessment and Plan:   Acute respite failure with hypoxia probably secondary to pneumonia Patient was started on IV antibiotics would continue the same for another 24 hours.  Symptomatic management with cough medication. Strep pneumonia urinary antigen is pending Follow-up blood cultures and sputum cultures. COVID PCR is negative respiratory panel significant for parainfluenza virus. Continue with steroids.      Obesity  Body mass index is 41.5 kg/m.       Tobacco abuse:  - on nicotine patch.    OSA  On CPAP.    E coli in the Urine:  Resistant to ampicillin and ciprofloxacin.  Sent in prescription for keflex to complete 4 day supply .    Consultants: none.  Procedures performed: none.   Disposition: Home Diet recommendation:  Discharge Diet Orders (From admission, onward)     Start     Ordered   05/16/22 0000  Diet - low sodium heart healthy        05/16/22 1008           Regular diet DISCHARGE MEDICATION: Allergies as of 05/16/2022       Reactions   Amoxicillin         Medication List     STOP taking  these medications    ondansetron 8 MG disintegrating tablet Commonly known as: ZOFRAN-ODT       TAKE these medications    doxycycline 100 MG tablet Commonly known as: ADOXA Take 1 tablet (100 mg total) by mouth 2 (two) times daily for 5 days.   guaiFENesin-dextromethorphan 100-10 MG/5ML syrup Commonly known as: ROBITUSSIN DM Take 10 mLs by mouth every 4 (four) hours as needed for cough.   nicotine 14 mg/24hr patch Commonly known as: NICODERM CQ - dosed in mg/24 hours Place 1 patch (14 mg total) onto the skin daily. Start taking on: May 17, 2022   predniSONE 10 MG tablet Commonly known as: DELTASONE Take 4 tablets (40 mg total) by mouth daily with breakfast for 5 days.        Discharge Exam: Filed Weights   05/14/22 2139  Weight: (!) 150.6 kg   General exam: Appears calm and comfortable  Respiratory system: Clear to auscultation. Respiratory effort normal. Cardiovascular system: S1 & S2 heard, RRR. No JVD,  No pedal edema. Gastrointestinal system: Abdomen is nondistended, soft and nontender.  Central nervous system: Alert and oriented. No focal neurological deficits. Extremities: Symmetric 5 x 5 power. Skin: No rashes, lesions or ulcers Psychiatry: Judgement and insight appear normal. Mood & affect appropriate.    Condition at discharge: fair  The results of significant diagnostics from this  hospitalization (including imaging, microbiology, ancillary and laboratory) are listed below for reference.   Imaging Studies: CT Angio Chest Pulmonary Embolism (PE) W or WO Contrast  Result Date: 05/14/2022 CLINICAL DATA:  Suspected pulmonary embolism in a 31 year old male with history of shortness of breath on exertion since last evening. EXAM: CT ANGIOGRAPHY CHEST WITH CONTRAST TECHNIQUE: Multidetector CT imaging of the chest was performed using the standard protocol during bolus administration of intravenous contrast. Multiplanar CT image reconstructions and MIPs were  obtained to evaluate the vascular anatomy. RADIATION DOSE REDUCTION: This exam was performed according to the departmental dose-optimization program which includes automated exposure control, adjustment of the mA and/or kV according to patient size and/or use of iterative reconstruction technique. CONTRAST:  114mL OMNIPAQUE IOHEXOL 350 MG/ML SOLN COMPARISON:  Prior chest x-rays are available for comparison. No signs of cross-sectional imaging. FINDINGS: Cardiovascular: Heart size is normal. No pericardial effusion or nodularity. The aortic caliber is normal. No stranding adjacent to the aorta. Aorta with normal three-vessel branching pattern. Central pulmonary arteries with moderately suboptimal opacification at 190 Hounsfield units. No signs of central or lobar level pulmonary embolism. Mediastinum/Nodes: Scattered lymph nodes throughout the chest top-normal size. (Image 135/5) 14 mm RIGHT hilar lymph node. (Image 168/5) 13 mm RIGHT infrahilar lymph node. Mild fullness of LEFT hilar nodal tissue though in the 5-6 mm range. No signs of mediastinal adenopathy. No thoracic inlet adenopathy. No axillary adenopathy. Lungs/Pleura: Patchy subtle ground-glass in the RIGHT upper lobe associated with mild bronchial wall thickening. Patchy ground-glass in the RIGHT lower lobe with multifocal nodularity. Largest area of discrete nodularity 8 x 7 mm (image 107/6) again with mild bronchial wall thickening more pronounced on the RIGHT than the LEFT and more pronounced in the RIGHT lower lobe. No pneumothorax. No sign of pleural effusion. Upper Abdomen: No acute findings relative to visualized portions of liver, gallbladder, pancreas, spleen, adrenal glands or of the kidneys. Limited imaging of upper abdominal contents in general. No upper abdominal adenopathy. Suspect hepatic steatosis with fissural widening of hepatic fissures. Musculoskeletal: No acute musculoskeletal process or destructive bone finding. Review of the MIP  images confirms the above findings. IMPRESSION: 1. No signs of central or lobar level pulmonary embolism. Study limited by patient's body habitus and bolus timing. 2. Patchy subtle ground-glass in the RIGHT upper lobe associated with mild bronchial wall thickening. Patchy ground-glass in the RIGHT lower lobe with multifocal nodularity. Largest area of discrete nodularity 8 x 7 mm. Findings are favored to represent sequela of infectious bronchiolitis. Given nodular appearance and the presence of top-normal lymph nodes would suggest 8-12 week follow-up to ensure resolution 3. Mildly enlarged RIGHT hilar and infrahilar lymph nodes are likely reactive. 4. Query a [place]ock steatosis and fissural widening of hepatic fissures, a finding that can be seen in the setting of early liver disease. Correlate clinically . Electronically Signed   By: Zetta Bills M.D.   On: 05/14/2022 15:28   DG Chest 2 View  Result Date: 05/14/2022 CLINICAL DATA:  Shortness of breath, fever, headache EXAM: CHEST - 2 VIEW COMPARISON:  Chest radiograph 04/11/2019 FINDINGS: The heart is borderline enlarged. The upper mediastinal contours are normal. There is no focal consolidation or pulmonary edema. There is no pleural effusion or pneumothorax. There is no acute osseous abnormality. IMPRESSION: Borderline cardiomegaly. Otherwise, no radiographic evidence of acute cardiopulmonary process. Electronically Signed   By: Valetta Mole M.D.   On: 05/14/2022 14:14    Microbiology: Results for orders placed or performed during  the hospital encounter of 05/14/22  Resp Panel by RT-PCR (Flu A&B, Covid) Anterior Nasal Swab     Status: None   Collection Time: 05/14/22 12:55 PM   Specimen: Anterior Nasal Swab  Result Value Ref Range Status   SARS Coronavirus 2 by RT PCR NEGATIVE NEGATIVE Final    Comment: (NOTE) SARS-CoV-2 target nucleic acids are NOT DETECTED.  The SARS-CoV-2 RNA is generally detectable in upper respiratory specimens during the  acute phase of infection. The lowest concentration of SARS-CoV-2 viral copies this assay can detect is 138 copies/mL. A negative result does not preclude SARS-Cov-2 infection and should not be used as the sole basis for treatment or other patient management decisions. A negative result may occur with  improper specimen collection/handling, submission of specimen other than nasopharyngeal swab, presence of viral mutation(s) within the areas targeted by this assay, and inadequate number of viral copies(<138 copies/mL). A negative result must be combined with clinical observations, patient history, and epidemiological information. The expected result is Negative.  Fact Sheet for Patients:  EntrepreneurPulse.com.au  Fact Sheet for Healthcare Providers:  IncredibleEmployment.be  This test is no t yet approved or cleared by the [place] FDA and  has been authorized for detection and/or diagnosis of SARS-CoV-2 by FDA under an Emergency Use Authorization (EUA). This EUA will remain  in effect (meaning this test can be used) for the duration of the COVID-19 declaration under Section 564(b)(1) of the Act, 21 U.S.C.section 360bbb-3(b)(1), unless the authorization is terminated  or revoked sooner.       Influenza A by PCR NEGATIVE NEGATIVE Final   Influenza B by PCR NEGATIVE NEGATIVE Final    Comment: (NOTE) The Xpert Xpress SARS-CoV-2/FLU/RSV plus assay is intended as an aid in the diagnosis of influenza from Nasopharyngeal swab specimens and should not be used as a sole basis for treatment. Nasal washings and aspirates are unacceptable for Xpert Xpress SARS-CoV-2/FLU/RSV testing.  Fact Sheet for Patients: EntrepreneurPulse.com.au  Fact Sheet for Healthcare Providers: IncredibleEmployment.be  This test is not yet approved or cleared by the [place] FDA and has been authorized for detection and/or  diagnosis of SARS-CoV-2 by FDA under an Emergency Use Authorization (EUA). This EUA will remain in effect (meaning this test can be used) for the duration of the COVID-19 declaration under Section 564(b)(1) of the Act, 21 U.S.C. section 360bbb-3(b)(1), unless the authorization is terminated or revoked.  Performed at [facility], [address] [place], [place] [address]   Culture, blood (Routine x 2)     Status: None (Preliminary result)   Collection Time: 05/14/22  1:05 PM   Specimen: BLOOD  Result Value Ref Range Status   Specimen Description   Final    BLOOD RIGHT ANTECUBITAL Performed at [facility], [address], [place], [place] [address]    Special Requests   Final    Blood Culture adequate volume BOTTLES DRAWN AEROBIC AND ANAEROBIC Performed at [facility], [address], [place], [place] [address]    Culture   Final    NO GROWTH 2 DAYS Performed at [facility], [place] [address] [place], [place] [address]    Report Status PENDING  Incomplete  Respiratory (~20 pathogens) panel by PCR     Status: Abnormal   Collection Time: 05/14/22  4:49 PM   Specimen: Nasopharyngeal Swab; Respiratory  Result Value Ref Range Status   Adenovirus NOT DETECTED NOT DETECTED Final   Coronavirus 229E NOT DETECTED NOT DETECTED Final  Comment: (NOTE) The Coronavirus on the Respiratory Panel, DOES NOT test for the novel  Coronavirus (2019 nCoV)    Coronavirus HKU1 NOT DETECTED NOT DETECTED Final   Coronavirus NL63 NOT DETECTED NOT DETECTED Final   Coronavirus OC43 NOT DETECTED NOT DETECTED Final   Metapneumovirus NOT DETECTED NOT DETECTED Final   Rhinovirus / Enterovirus NOT DETECTED NOT DETECTED Final   Influenza A NOT DETECTED NOT DETECTED Final   Influenza B NOT DETECTED NOT DETECTED Final   Parainfluenza Virus 1 NOT DETECTED NOT DETECTED Final   Parainfluenza Virus 2 NOT DETECTED NOT DETECTED Final    Parainfluenza Virus 3 NOT DETECTED NOT DETECTED Final   Parainfluenza Virus 4 DETECTED (A) NOT DETECTED Final   Respiratory Syncytial Virus NOT DETECTED NOT DETECTED Final   Bordetella pertussis NOT DETECTED NOT DETECTED Final   Bordetella Parapertussis NOT DETECTED NOT DETECTED Final   Chlamydophila pneumoniae NOT DETECTED NOT DETECTED Final   Mycoplasma pneumoniae NOT DETECTED NOT DETECTED Final    Comment: Performed at [facility], [address], [place], [place] [address]  MRSA Next Gen by PCR, Nasal     Status: None   Collection Time: 05/14/22 10:05 PM   Specimen: Nasal Mucosa; Nasal Swab  Result Value Ref Range Status   MRSA by PCR Next Gen NOT DETECTED NOT DETECTED Final    Comment: (NOTE) The GeneXpert MRSA Assay (FDA approved for NASAL specimens only), is one component of a comprehensive MRSA colonization surveillance program. It is not intended to diagnose MRSA infection nor to guide or monitor treatment for MRSA infections. Test performance is not FDA approved in patients less than 74 years old. Performed at [facility], [address], [place], [place] [address]   Expectorated Sputum Assessment w Gram Stain, Rflx to Resp Cult     Status: None   Collection Time: 05/14/22 11:29 PM   Specimen: Expectorated Sputum  Result Value Ref Range Status   Specimen Description EXPECTORATED SPUTUM  Final   Special Requests Normal  Final   Sputum evaluation   Final    THIS SPECIMEN IS ACCEPTABLE FOR SPUTUM CULTURE Performed at [facility], [address] [place], [place] [address]    Report Status 05/15/2022 FINAL  Final  Culture, Respiratory w Gram Stain     Status: None (Preliminary result)   Collection Time: 05/14/22 11:29 PM  Result Value Ref Range Status   Specimen Description   Final    EXPECTORATED SPUTUM Performed at [facility], [address], [place], [place] [address]    Special Requests   Final     Normal Reflexed from [phone] Performed at [facility], [address], [place], [place] [address]    Gram Stain   Final    FEW WBC PRESENT,BOTH PMN AND MONONUCLEAR RARE GRAM POSITIVE COCCI IN PAIRS RARE GRAM NEGATIVE RODS    Culture   Final    CULTURE REINCUBATED FOR BETTER GROWTH Performed at [facility], [address] [place], [place] [address]    Report Status PENDING  Incomplete  Urine Culture     Status: Abnormal (Preliminary result)   Collection Time: 05/14/22 11:50 PM   Specimen: Urine, Catheterized  Result Value Ref Range Status   Specimen Description   Final    URINE, CATHETERIZED Performed at [facility], [address] [place], [place] [address]    Special Requests   Final    Normal Performed  at [facility], [address] [place], [place] [address]    Culture (A)  Final    >=100,000 COLONIES/mL ESCHERICHIA COLI SUSCEPTIBILITIES TO FOLLOW Performed at [facility], [address] [place], [place] [address]    Report Status PENDING  Incomplete    Labs: CBC: Recent Labs  Lab 05/14/22 1306 05/15/22 0352  WBC 8.2 10.2  NEUTROABS 5.7  --   HGB 14.9 13.7  HCT 45.0 42.8  MCV 85.7 86.8  PLT 241 211   Basic Metabolic Panel: Recent Labs  Lab 05/14/22 1306 05/14/22 2321 05/15/22 0352  NA 138  --  138  K 4.0  --  4.3  CL 100  --  103  CO2 29  --  28  GLUCOSE 96  --  121*  BUN 10  --  9  CREATININE 0.83  --  0.60*  CALCIUM 9.5  --  8.9  MG  --  2.3 2.4  PHOS  --  4.0 3.0   Liver Function Tests: Recent Labs  Lab 05/14/22 1306 05/15/22 0352  AST 23 36  ALT 37 49*  ALKPHOS 42 42  BILITOT 0.9 0.9  PROT 8.6* 8.2*  ALBUMIN 5.0 3.8   CBG: No results for input(s): "GLUCAP" in the last 168 hours.  Discharge time spent: 42 minutes.   Signed: Kathlen Mody, MD [facility] 05/16/2022

## 2022-05-16 NOTE — Plan of Care (Signed)
  Problem: Education: Goal: Knowledge of General Education information will improve Description: Including pain rating scale, medication(s)/side effects and non-pharmacologic comfort measures Outcome: Progressing   Problem: Health Behavior/Discharge Planning: Goal: Ability to manage health-related needs will improve Outcome: Progressing   Problem: Clinical Measurements: Goal: Ability to maintain clinical measurements within normal limits will improve Outcome: Progressing Goal: Will remain free from infection Outcome: Progressing   Problem: Activity: Goal: Risk for activity intolerance will decrease Outcome: Progressing   Problem: Nutrition: Goal: Adequate nutrition will be maintained Outcome: Completed/Met

## 2022-05-16 NOTE — Plan of Care (Signed)
  Problem: Education: Goal: Knowledge of General Education information will improve Description Including pain rating scale, medication(s)/side effects and non-pharmacologic comfort measures Outcome: Progressing   Problem: Health Behavior/Discharge Planning: Goal: Ability to manage health-related needs will improve Outcome: Progressing   

## 2022-05-16 NOTE — TOC Transition Note (Signed)
Transition of Care [facility]) - CM/SW Discharge Note   Patient Details  Name: Wesley Wheeler MRN: [id number] Date of Birth: 1991/01/20  Transition of Care [facility]) CM/SW Contact:  Golda Acre, RN Phone Number: 05/16/2022, 2:25 PM   Clinical Narrative:    113023/patient discharged to return home.  Chart reviewed for TOC needs.  None found.  Patient self care.   Final next level of care: Home/Self Care Barriers to Discharge: Barriers Resolved   Patient Goals and CMS Choice Patient states their goals for this hospitalization and ongoing recovery are:: to go home CMS Medicare.gov Compare Post Acute Care list provided to:: Patient    Discharge Placement                       Discharge Plan and Services   Discharge Planning Services: CM Consult                                 Social Determinants of Health (SDOH) Interventions Housing Interventions: Intervention Not Indicated   Readmission Risk Interventions   No data to display

## 2022-05-17 LAB — URINE CULTURE
Culture: >=100000 — AB
Special Requests: NORMAL

## 2022-05-17 LAB — CULTURE, RESPIRATORY W GRAM STAIN
Culture: NORMAL
Special Requests: NORMAL

## 2022-05-19 LAB — CULTURE, BLOOD (ROUTINE X 2)
Culture: NO GROWTH
Special Requests: ADEQUATE

## 2022-09-13 DIAGNOSIS — Z1329 Encounter for screening for other suspected endocrine disorder: Secondary | ICD-10-CM | POA: Diagnosis not present

## 2022-09-13 DIAGNOSIS — Z130 Encounter for screening for diseases of the blood and blood-forming organs and certain disorders involving the immune mechanism: Secondary | ICD-10-CM | POA: Diagnosis not present

## 2022-09-13 DIAGNOSIS — R5381 Other malaise: Secondary | ICD-10-CM | POA: Diagnosis not present

## 2022-09-13 DIAGNOSIS — N521 Erectile dysfunction due to diseases classified elsewhere: Secondary | ICD-10-CM | POA: Diagnosis not present

## 2022-09-13 DIAGNOSIS — E291 Testicular hypofunction: Secondary | ICD-10-CM | POA: Diagnosis not present

## 2022-09-13 DIAGNOSIS — Z13228 Encounter for screening for other metabolic disorders: Secondary | ICD-10-CM | POA: Diagnosis not present

## 2022-09-13 DIAGNOSIS — R6882 Decreased libido: Secondary | ICD-10-CM | POA: Diagnosis not present

## 2022-09-13 DIAGNOSIS — Z125 Encounter for screening for malignant neoplasm of prostate: Secondary | ICD-10-CM | POA: Diagnosis not present

## 2022-09-13 DIAGNOSIS — Z79890 Hormone replacement therapy (postmenopausal): Secondary | ICD-10-CM | POA: Diagnosis not present

## 2022-09-24 ENCOUNTER — Other Ambulatory Visit: Payer: Self-pay

## 2022-09-24 ENCOUNTER — Encounter (HOSPITAL_BASED_OUTPATIENT_CLINIC_OR_DEPARTMENT_OTHER): Payer: Self-pay | Admitting: Emergency Medicine

## 2022-09-24 ENCOUNTER — Emergency Department (HOSPITAL_BASED_OUTPATIENT_CLINIC_OR_DEPARTMENT_OTHER): Payer: BC Managed Care – PPO | Admitting: Radiology

## 2022-09-24 ENCOUNTER — Emergency Department (HOSPITAL_BASED_OUTPATIENT_CLINIC_OR_DEPARTMENT_OTHER)
Admission: EM | Admit: 2022-09-24 | Discharge: 2022-09-24 | Disposition: A | Discharge status: Home / Self Care | Payer: BC Managed Care – PPO | Attending: Emergency Medicine | Admitting: Emergency Medicine

## 2022-09-24 DIAGNOSIS — Z79899 Other long term (current) drug therapy: Secondary | ICD-10-CM | POA: Diagnosis not present

## 2022-09-24 DIAGNOSIS — I10 Essential (primary) hypertension: Secondary | ICD-10-CM | POA: Diagnosis not present

## 2022-09-24 DIAGNOSIS — M25511 Pain in right shoulder: Secondary | ICD-10-CM | POA: Diagnosis not present

## 2022-09-24 DIAGNOSIS — M542 Cervicalgia: Secondary | ICD-10-CM | POA: Insufficient documentation

## 2022-09-24 MED ORDER — AMLODIPINE BESYLATE 5 MG PO TABS: 5.0000 mg | ORAL_TABLET | Freq: Every day | ORAL | 2 refills | Status: DC

## 2022-09-24 MED ORDER — METHOCARBAMOL 500 MG PO TABS: 500.0000 mg | ORAL_TABLET | Freq: Two times a day (BID) | ORAL | 0 refills | Status: AC

## 2022-09-24 NOTE — ED Triage Notes (Signed)
Right shoulder pain x 1 week  Ibuprofen for pain with minimal relief. 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

## 2022-09-24 NOTE — ED Notes (Signed)
Pt cleared for discharge by PA. Reinforced discharge instructions provided by edp to pt. Pt verbalized understanding with no additional questions at this time.

## 2022-09-24 NOTE — ED Provider Notes (Signed)
[place] EMERGENCY DEPARTMENT AT [facility] Provider Note   CSN: [id number] Arrival date & time: 09/24/22  1702     History {Add pertinent medical, surgical, social history, OB history to HPI:1} Chief Complaint  Patient presents with   Shoulder Pain    Wesley Wheeler is a 32 y.o. male with past medical history significant for hypertension, obesity, tobacco abuse, OSA presents to the ED complaining of right shoulder and neck pain for the past week.  He reports that it began as neck pain that he thought was due to sleeping wrong but over a couple of days progressed into his right shoulder.  He states his neck is feeling improved.  He took a total of 2000 mg of ibuprofen prior to arrival without relief.  He states his pain is sharp and feels like he "is constantly getting a tattoo".  He has intermittent numbness and tingling in his fingers as well.  Denies weakness or numbness in his right arm.         Home Medications Prior to Admission medications   Medication Sig Start Date End Date Taking? Authorizing Provider  amLODipine (NORVASC) 5 MG tablet Take 1 tablet (5 mg total) by mouth daily. 09/24/22  Yes Alder Murri R, PA-C  methocarbamol (ROBAXIN) 500 MG tablet Take 1 tablet (500 mg total) by mouth 2 (two) times daily. 09/24/22  Yes Ziara Thelander R, PA-C  guaiFENesin-dextromethorphan (ROBITUSSIN DM) 100-10 MG/5ML syrup Take 10 mLs by mouth every 4 (four) hours as needed for cough. 05/16/22   Kathlen Mody, MD  nicotine (NICODERM CQ - DOSED IN MG/24 HOURS) 14 mg/24hr patch Place 1 patch (14 mg total) onto the skin daily. 05/17/22   Kathlen Mody, MD  albuterol (VENTOLIN HFA) 108 (90 Base) MCG/ACT inhaler Inhale 1-2 puffs into the lungs every 6 (six) hours as needed for wheezing or shortness of breath. 04/11/19 07/21/19  Domenick Gong, MD  fluticasone (FLONASE) 50 MCG/ACT nasal spray Place 2 sprays into both nostrils daily. 04/11/19 07/21/19  Domenick Gong, MD      Allergies     Amoxicillin    Review of Systems   Review of Systems  Physical Exam Updated Vital Signs BP (!) 179/110   Pulse 82   Temp 98.9 F (37.2 C) (Oral)   Resp 16   SpO2 97%  Physical Exam  ED Results / Procedures / Treatments   Labs (all labs ordered are listed, but only abnormal results are displayed) Labs Reviewed - No data to display  EKG None  Radiology DG Shoulder Right  Result Date: 09/24/2022 CLINICAL DATA:  Right shoulder pain for 1 week. Repetitive motion at work. EXAM: RIGHT SHOULDER - 2+ VIEW COMPARISON:  None Available. FINDINGS: There is no evidence of fracture or dislocation. There is no evidence of arthropathy or other focal bone abnormality. Soft tissues are unremarkable. IMPRESSION: Negative. Electronically Signed   By: Amie Portland M.D.   On: 09/24/2022 17:51    Procedures Procedures  {Document cardiac monitor, telemetry assessment procedure when appropriate:1}  Medications Ordered in ED Medications - No data to display  ED Course/ Medical Decision Making/ A&P   {   Click here for ABCD2, HEART and other calculatorsREFRESH Note before signing :1}                          Medical Decision Making Amount and/or Complexity of Data Reviewed Radiology: ordered.  Risk Prescription drug management.   ***  {  Document critical care time when appropriate:1} {Document review of labs and clinical decision tools ie heart score, Chads2Vasc2 etc:1}  {Document your independent review of radiology images, and any outside records:1} {Document your discussion with family members, caretakers, and with consultants:1} {Document social determinants of health affecting pt's care:1} {Document your decision making why or why not admission, treatments were needed:1} Final Clinical Impression(s) / ED Diagnoses Final diagnoses:  Acute pain of right shoulder  Uncontrolled hypertension    Rx / DC Orders ED Discharge Orders          Ordered    amLODipine (NORVASC) 5 MG  tablet  Daily        09/24/22 1842    methocarbamol (ROBAXIN) 500 MG tablet  2 times daily        09/24/22 1842

## 2022-09-24 NOTE — Discharge Instructions (Addendum)
Thank you for allowing me to be a part of your care.   I have sent over a muscle relaxant to your pharmacy to help with your right shoulder and neck tightness.  Do not operate heavy machinery or drive until you know how this medication will affect you.  It may make you dizzy or drowsy.   I have also sent over amlodipine which is for your blood pressure.  Please begin taking this medication daily.  I have provided the [facility] phone number to call to help you schedule a follow-up primary care appointment.    You may take 800 mg of ibuprofen alternated with 1000 mg of Tylenol every 4 hours as needed for pain.  DO NOT exceed 4000 mg of Tylenol or 3200 mg of ibuprofen in a 24-hour period as this can cause adverse effects such as bleeding in the stomach or liver damage.

## 2022-10-14 ENCOUNTER — Other Ambulatory Visit: Payer: Self-pay

## 2022-10-14 ENCOUNTER — Encounter (HOSPITAL_BASED_OUTPATIENT_CLINIC_OR_DEPARTMENT_OTHER): Payer: Self-pay

## 2022-10-14 DIAGNOSIS — Z1152 Encounter for screening for COVID-19: Secondary | ICD-10-CM | POA: Diagnosis not present

## 2022-10-14 DIAGNOSIS — I10 Essential (primary) hypertension: Secondary | ICD-10-CM | POA: Diagnosis not present

## 2022-10-14 DIAGNOSIS — B349 Viral infection, unspecified: Secondary | ICD-10-CM | POA: Insufficient documentation

## 2022-10-14 DIAGNOSIS — R6883 Chills (without fever): Secondary | ICD-10-CM | POA: Diagnosis not present

## 2022-10-14 LAB — RESP PANEL BY RT-PCR (RSV, FLU A&B, COVID)  RVPGX2
Influenza A by PCR: NEGATIVE
Influenza B by PCR: NEGATIVE
Resp Syncytial Virus by PCR: NEGATIVE
SARS Coronavirus 2 by RT PCR: NEGATIVE

## 2022-10-14 LAB — GROUP A STREP BY PCR: Group A Strep by PCR: NOT DETECTED

## 2022-10-14 NOTE — ED Triage Notes (Signed)
Patient here POV from Home.  Endorses fatigue, Dry Cough, Scratchy Throat, Possible Fever, Chills that began today.  NAD Noted during triage. A&Ox4. Gcs 15. Ambulatory.

## 2022-10-15 ENCOUNTER — Emergency Department (HOSPITAL_BASED_OUTPATIENT_CLINIC_OR_DEPARTMENT_OTHER)
Admission: EM | Admit: 2022-10-15 | Discharge: 2022-10-15 | Disposition: A | Discharge status: Home / Self Care | Payer: BC Managed Care – PPO | Attending: Emergency Medicine | Admitting: Emergency Medicine

## 2022-10-15 DIAGNOSIS — B349 Viral infection, unspecified: Secondary | ICD-10-CM

## 2022-10-15 MED ORDER — NAPROXEN 500 MG PO TABS: 500.0000 mg | ORAL_TABLET | Freq: Two times a day (BID) | ORAL | 0 refills | Status: AC

## 2022-10-15 NOTE — Discharge Instructions (Signed)
You were evaluated in the Emergency Department and after careful evaluation, we did not find any emergent condition requiring admission or further testing in the hospital.  Your exam/testing today was overall reassuring.  Symptoms likely due to a viral illness.  Take the Naprosyn twice daily as needed for discomfort.  Please return to the Emergency Department if you experience any worsening of your condition.  Thank you for allowing [place] to be a part of your care.

## 2022-10-15 NOTE — ED Provider Notes (Signed)
[facility] EMERGENCY [facility] Emergency Department Provider Note MRN:  [id number]  Arrival date & time: 10/15/22     Chief Complaint   Chills   History of Present Illness   Wesley Wheeler is a 32 y.o. year-old male with a history of hypertension presenting to the ED with chief complaint of chills.  About 2 hours ago patient felt a tickle or mild discomfort in the back of his throat, he then felt hot all over.  He knows that this is how he feels when he is getting sick.  Here for evaluation.  No chest pain or shortness of breath, no rash, no headache, no other complaints.  Review of Systems  A thorough review of systems was obtained and all systems are negative except as noted in the HPI and PMH.   Patient's Health History    Past Medical History:  Diagnosis Date   Hypertension    Sleep apnea     Past Surgical History:  Procedure Laterality Date   NASAL SINUS SURGERY     NOSE SURGERY     benign tumor    Family History  Problem Relation Age of Onset   Hypertension Mother    Diabetes Father     Social History   Socioeconomic History   Marital status: Married    Spouse name: Not on file   Number of children: Not on file   Years of education: Not on file   Highest education level: Not on file  Occupational History   Not on file  Tobacco Use   Smoking status: Every Day    Types: Cigars   Smokeless tobacco: Never  Vaping Use   Vaping Use: Never used  Substance and Sexual Activity   Alcohol use: Yes    Comment: Occ   Drug use: No   Sexual activity: Not on file  Other Topics Concern   Not on file  Social History Narrative   Not on file   Social Determinants of Health   Financial Resource Strain: Not on file  Food Insecurity: No Food Insecurity (05/14/2022)   Hunger Vital Sign    Worried About Running Out of Food in the Last Year: Never true    Ran Out of Food in the Last Year: Never true  Transportation Needs: No Transportation Needs (05/14/2022)    PRAPARE - Administrator, Civil Service (Medical): No    Lack of Transportation (Non-Medical): No  Physical Activity: Not on file  Stress: Not on file  Social Connections: Not on file  Intimate Partner Violence: Not At Risk (05/14/2022)   Humiliation, Afraid, Rape, and Kick questionnaire    Fear of Current or Ex-Partner: No    Emotionally Abused: No    Physically Abused: No    Sexually Abused: No     Physical Exam   Vitals:   10/15/22 0012 10/15/22 0013  BP:    Pulse: (!) 104   Resp:    Temp:  99 F (37.2 C)  SpO2: 95%     CONSTITUTIONAL: Well-appearing, NAD NEURO/PSYCH:  Alert and oriented x 3, no focal deficits EYES:  eyes equal and reactive ENT/NECK:  no LAD, no JVD CARDIO: Regular rate, well-perfused, normal S1 and S2 PULM:  CTAB no wheezing or rhonchi GI/GU:  non-distended, non-tender MSK/SPINE:  No gross deformities, no edema SKIN:  no rash, atraumatic   *Additional and/or pertinent findings included in MDM below  Diagnostic and Interventional Summary    EKG Interpretation  Date/Time:  Ventricular Rate:    PR Interval:    QRS Duration:   QT Interval:    QTC Calculation:   R Axis:     Text Interpretation:         Labs Reviewed  RESP PANEL BY RT-PCR (RSV, FLU A&B, COVID)  RVPGX2  GROUP A STREP BY PCR    No orders to display    Medications - No data to display   Procedures  /  Critical Care Procedures  ED Course and Medical Decision Making  Initial Impression and Ddx Well-appearing with normal vital signs, no increased work of breathing, no meningismus, minimal symptoms, nothing to suggest emergent process.  All seems consistent with early viral illness.  Past medical/surgical history that increases complexity of ED encounter: None  Interpretation of Diagnostics COVID, flu, RSV, strep negative  Patient Reassessment and Ultimate Disposition/Management     Discharge  Patient management required discussion with the following  services or consulting groups:  None  Complexity of Problems Addressed Acute complicated illness or Injury  Additional Data Reviewed and Analyzed Further history obtained from: None  Additional Factors Impacting ED Encounter Risk Prescriptions  Elmer Sow. Pilar Plate, MD [facility] Emergency Medicine [facility] [email or web address]  Final Clinical Impressions(s) / ED Diagnoses     ICD-10-CM   1. Viral illness  B34.9       ED Discharge Orders          Ordered    naproxen (NAPROSYN) 500 MG tablet  2 times daily        10/15/22 0045             Discharge Instructions Discussed with and Provided to Patient:    Discharge Instructions      You were evaluated in the Emergency Department and after careful evaluation, we did not find any emergent condition requiring admission or further testing in the hospital.  Your exam/testing today was overall reassuring.  Symptoms likely due to a viral illness.  Take the Naprosyn twice daily as needed for discomfort.  Please return to the Emergency Department if you experience any worsening of your condition.  Thank you for allowing [place] to be a part of your care.       Sabas Sous, MD 10/15/22 [phone]

## 2022-10-31 IMAGING — DX DG CERVICAL SPINE COMPLETE 4+V
5 series · 5 of 5 positions shown · non-contrast
Comparison: None.

CLINICAL DATA: RIGHT neck pain with radicular pain and numbness
down RIGHT arm for 1 week.

EXAM:
CERVICAL SPINE - COMPLETE 4+ VIEW

[c-spine lat]
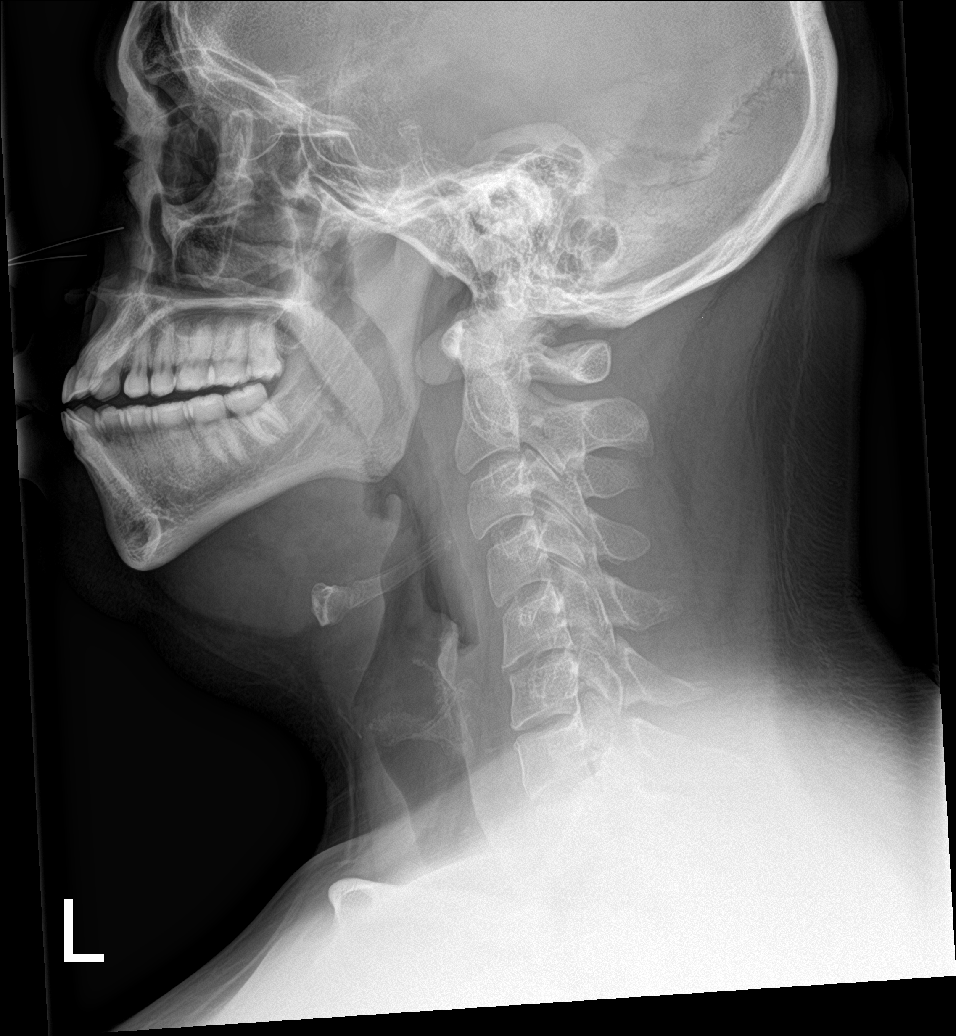

[c-spine obl (1 of 2)]
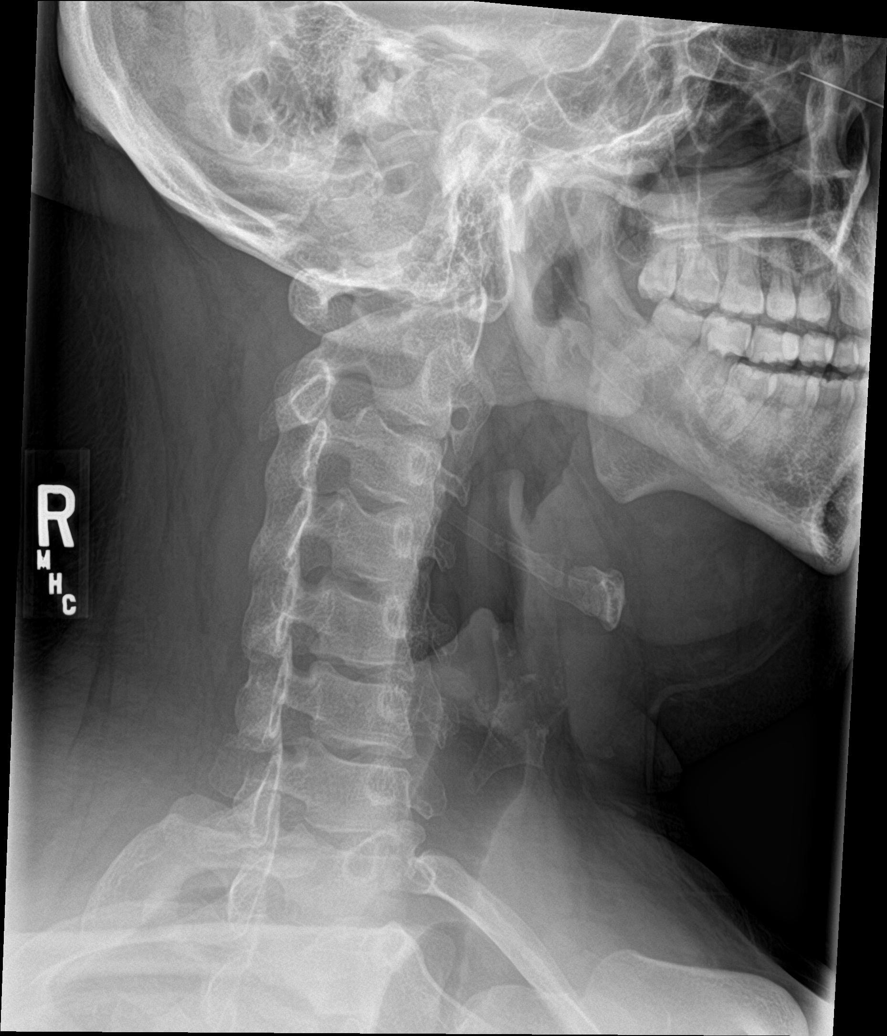

[c-spine obl (2 of 2)]
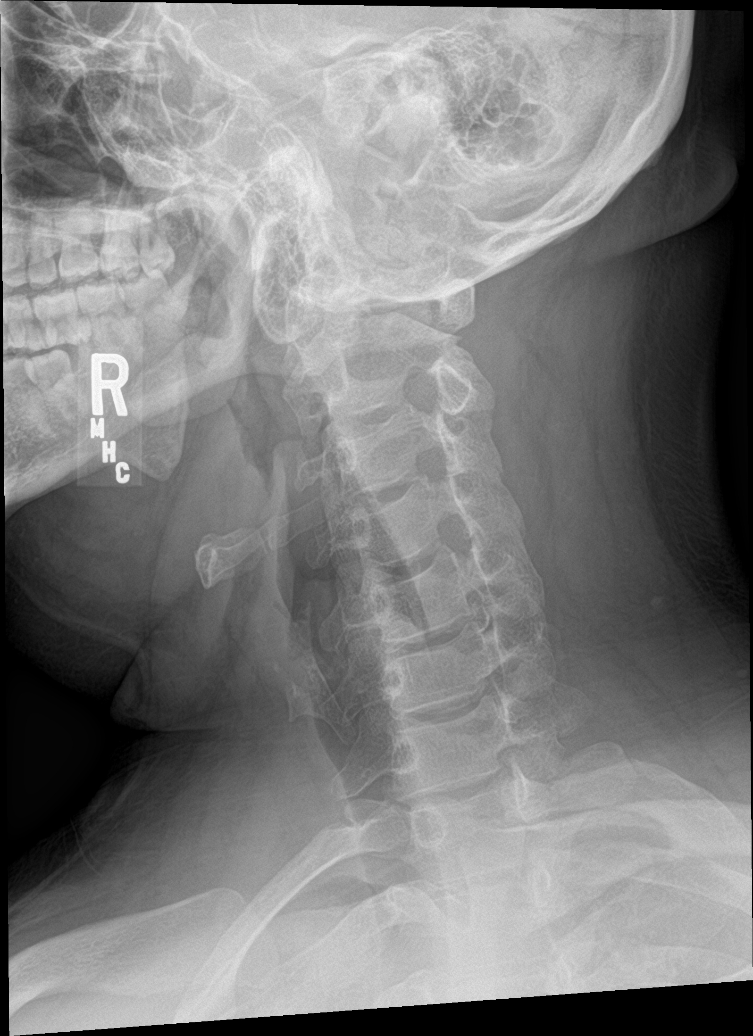

[c-spine ap]
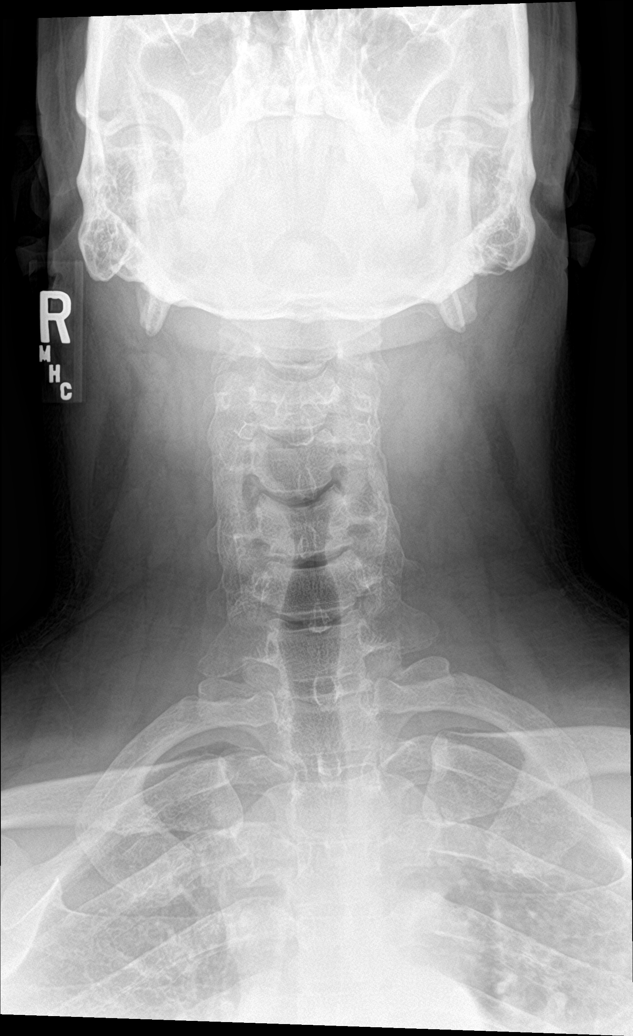

[c-spine open mouth]
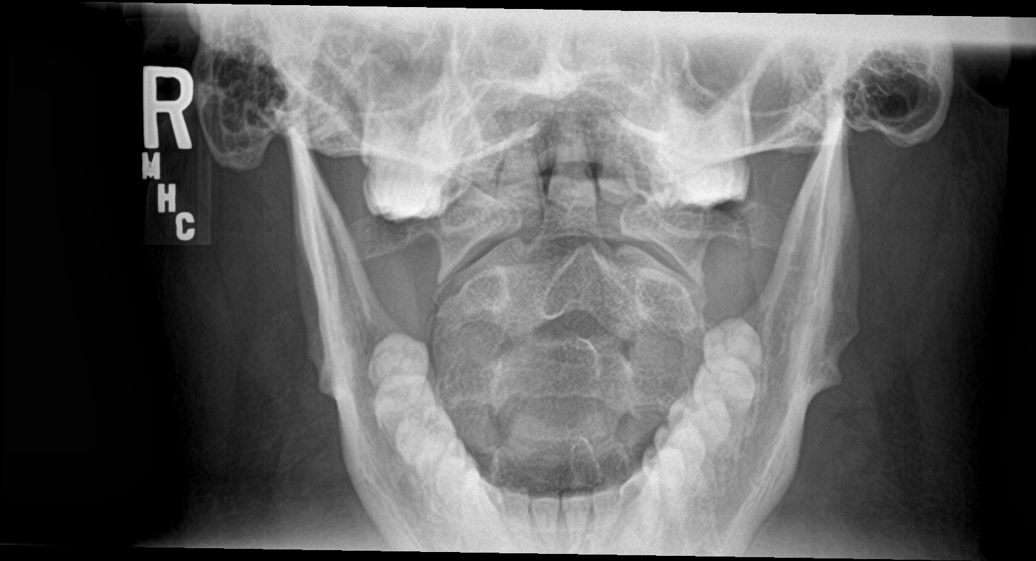

[5 of 5 positions shown; findings below may reference images not displayed]

FINDINGS: Mild dextroscoliosis which may be accentuated by patient
positioning. Slight reversal of the normal cervical spine lordosis.
No evidence of acute vertebral body subluxation. No fracture line or
displaced fracture fragment. No acute-appearing cortical
irregularity or osseous lesion. No appreciable degenerative change.
Prevertebral soft tissues appear normal in thickness.
IMPRESSION: 1. No acute findings.
2. Mild scoliosis and reversal of the normal cervical spine
lordosis, which may be related to patient positioning or muscle
spasm.
3. No degenerative change seen.

## 2023-07-23 ENCOUNTER — Emergency Department (HOSPITAL_BASED_OUTPATIENT_CLINIC_OR_DEPARTMENT_OTHER)
Admission: EM | Admit: 2023-07-23 | Discharge: 2023-07-23 | Disposition: A | Discharge status: Home / Self Care | Payer: BC Managed Care – PPO | Attending: Emergency Medicine | Admitting: Emergency Medicine

## 2023-07-23 ENCOUNTER — Other Ambulatory Visit: Payer: Self-pay

## 2023-07-23 ENCOUNTER — Encounter (HOSPITAL_BASED_OUTPATIENT_CLINIC_OR_DEPARTMENT_OTHER): Payer: Self-pay | Admitting: Emergency Medicine

## 2023-07-23 DIAGNOSIS — Z20822 Contact with and (suspected) exposure to covid-19: Secondary | ICD-10-CM | POA: Insufficient documentation

## 2023-07-23 DIAGNOSIS — R059 Cough, unspecified: Secondary | ICD-10-CM | POA: Diagnosis not present

## 2023-07-23 DIAGNOSIS — J069 Acute upper respiratory infection, unspecified: Secondary | ICD-10-CM | POA: Diagnosis not present

## 2023-07-23 DIAGNOSIS — B9789 Other viral agents as the cause of diseases classified elsewhere: Secondary | ICD-10-CM | POA: Diagnosis not present

## 2023-07-23 LAB — RESP PANEL BY RT-PCR (RSV, FLU A&B, COVID)  RVPGX2
Influenza A by PCR: NEGATIVE
Influenza B by PCR: NEGATIVE
Resp Syncytial Virus by PCR: NEGATIVE
SARS Coronavirus 2 by RT PCR: NEGATIVE

## 2023-07-23 MED ORDER — BENZONATATE 100 MG PO CAPS: 100.0000 mg | ORAL_CAPSULE | Freq: Three times a day (TID) | ORAL | 0 refills | Status: AC

## 2023-07-23 NOTE — Discharge Instructions (Signed)
Take Tylenol and ibuprofen for fever and control.  Take cough medicine as you need to.  Follow-up with your primary care provider.  Respiratory panel was negative for COVID, flu, RSV.  Return for any emergent symptoms.

## 2023-07-23 NOTE — ED Triage Notes (Signed)
 Chill, fatigue, cough congestion runny nose started yesterday

## 2023-07-23 NOTE — ED Provider Notes (Signed)
 [place] EMERGENCY DEPARTMENT AT [facility] Provider Note   CSN: [id number] Arrival date & time: 07/23/23  1705     History  No chief complaint on file.   Wesley Wheeler is a 33 y.o. male.  33 year old male presents today for concern of respiratory symptoms for the past 1.5 days.  Denies any known sick contacts.  States he is having fever, cough, congestion.  No chest pain or dyspnea.  The history is provided by the patient. No language interpreter was used.       Home Medications Prior to Admission medications   Medication Sig Start Date End Date Taking? Authorizing Provider  benzonatate  (TESSALON ) 100 MG capsule Take 1 capsule (100 mg total) by mouth every 8 (eight) hours. 07/23/23  Yes Juanita Devincent, PA-C  amLODipine  (NORVASC ) 5 MG tablet Take 1 tablet (5 mg total) by mouth daily. 09/24/22   Clark, Meghan R, PA-C  guaiFENesin -dextromethorphan (ROBITUSSIN DM) 100-10 MG/5ML syrup Take 10 mLs by mouth every 4 (four) hours as needed for cough. 05/16/22   Akula, Vijaya, MD  methocarbamol  (ROBAXIN ) 500 MG tablet Take 1 tablet (500 mg total) by mouth 2 (two) times daily. 09/24/22   Clark, Meghan R, PA-C  naproxen  (NAPROSYN ) 500 MG tablet Take 1 tablet (500 mg total) by mouth 2 (two) times daily. 10/15/22   Bero, Michael M, MD  nicotine  (NICODERM CQ  - DOSED IN MG/24 HOURS) 14 mg/24hr patch Place 1 patch (14 mg total) onto the skin daily. 05/17/22   Akula, Vijaya, MD  albuterol  (VENTOLIN  HFA) 108 (90 Base) MCG/ACT inhaler Inhale 1-2 puffs into the lungs every 6 (six) hours as needed for wheezing or shortness of breath. 04/11/19 07/21/19  Van Knee, MD  fluticasone  (FLONASE ) 50 MCG/ACT nasal spray Place 2 sprays into both nostrils daily. 04/11/19 07/21/19  Van Knee, MD      Allergies    Amoxicillin    Review of Systems   Review of Systems  Constitutional:  Positive for fever. Negative for chills.  Respiratory:  Positive for cough. Negative for shortness of breath.    Cardiovascular:  Negative for chest pain.  Neurological:  Negative for light-headedness.  All other systems reviewed and are negative.   Physical Exam Updated Vital Signs BP (!) 144/93   Pulse 90   Temp 100 F (37.8 C)   Resp 16   SpO2 97%  Physical Exam Vitals and nursing note reviewed.  Constitutional:      General: He is not in acute distress.    Appearance: Normal appearance. He is not ill-appearing.  HENT:     Head: Normocephalic and atraumatic.     Nose: Nose normal.  Eyes:     Conjunctiva/sclera: Conjunctivae normal.  Cardiovascular:     Rate and Rhythm: Normal rate and regular rhythm.  Pulmonary:     Effort: Pulmonary effort is normal. No respiratory distress.  Musculoskeletal:        General: No deformity. Normal range of motion.     Cervical back: Normal range of motion.  Skin:    Findings: No rash.  Neurological:     Mental Status: He is alert.     ED Results / Procedures / Treatments   Labs (all labs ordered are listed, but only abnormal results are displayed) Labs Reviewed  RESP PANEL BY RT-PCR (RSV, FLU A&B, COVID)  RVPGX2    EKG None  Radiology No results found.  Procedures Procedures    Medications Ordered in ED Medications - No data  to display  ED Course/ Medical Decision Making/ A&P                                 Medical Decision Making Risk Prescription drug management.   33 year old male presents today for concern of URI complaints.  Respiratory panel negative.  He is well-appearing.  Given his symptoms we will give him a work note.  Likely another viral upper respiratory infection outside of what we check for the respiratory panel.  Patient is appropriate for discharge.  Discharged in stable condition.  Return precaution discussed.   Final Clinical Impression(s) / ED Diagnoses Final diagnoses:  Viral URI with cough    Rx / DC Orders ED Discharge Orders          Ordered    benzonatate  (TESSALON ) 100 MG capsule  Every  8 hours        07/23/23 2255              Hildegard Loge, PA-C 07/23/23 2322    Cottie Donnice PARAS, MD 07/24/23 [phone]

## 2023-07-23 NOTE — ED Provider Triage Note (Signed)
 Emergency Medicine Provider Triage Evaluation Note  MAUREEN DUESING , a 33 y.o. male  was evaluated in triage.  Pt complains of flu like symptoms since yesterday. No chest pain or dyspnea.  Review of Systems  Positive: As above Negative: As above  Physical Exam  BP (!) 157/94 (BP Location: Left Arm)   Pulse 92   Temp 100 F (37.8 C)   Resp 18   SpO2 96%  Gen:   Awake, no distress   Resp:  Normal effort  MSK:   Moves extremities without difficulty Other:    Medical Decision Making  Medically screening exam initiated at 6:11 PM.  Appropriate orders placed.  LEMUEL BOODRAM was informed that the remainder of the evaluation will be completed by another provider, this initial triage assessment does not replace that evaluation, and the importance of remaining in the ED until their evaluation is complete.     Hildegard Loge, PA-C 07/23/23 [phone]

## 2023-07-23 NOTE — ED Notes (Addendum)
 Patient requested covid/flu swab

## 2023-12-10 DIAGNOSIS — Z23 Encounter for immunization: Secondary | ICD-10-CM | POA: Diagnosis not present

## 2023-12-10 DIAGNOSIS — Z0000 Encounter for general adult medical examination without abnormal findings: Secondary | ICD-10-CM | POA: Diagnosis not present

## 2023-12-10 DIAGNOSIS — Z6841 Body Mass Index (BMI) 40.0 and over, adult: Secondary | ICD-10-CM | POA: Diagnosis not present

## 2023-12-10 DIAGNOSIS — I10 Essential (primary) hypertension: Secondary | ICD-10-CM | POA: Diagnosis not present

## 2023-12-16 DIAGNOSIS — G4730 Sleep apnea, unspecified: Secondary | ICD-10-CM | POA: Diagnosis not present

## 2024-01-01 ENCOUNTER — Emergency Department (HOSPITAL_BASED_OUTPATIENT_CLINIC_OR_DEPARTMENT_OTHER)
Admission: EM | Admit: 2024-01-01 | Discharge: 2024-01-01 | Disposition: A | Discharge status: Home / Self Care | Attending: Emergency Medicine | Admitting: Emergency Medicine

## 2024-01-01 ENCOUNTER — Other Ambulatory Visit: Payer: Self-pay

## 2024-01-01 ENCOUNTER — Encounter (HOSPITAL_BASED_OUTPATIENT_CLINIC_OR_DEPARTMENT_OTHER): Payer: Self-pay | Admitting: Radiology

## 2024-01-01 DIAGNOSIS — I10 Essential (primary) hypertension: Secondary | ICD-10-CM | POA: Insufficient documentation

## 2024-01-01 DIAGNOSIS — Z79899 Other long term (current) drug therapy: Secondary | ICD-10-CM | POA: Diagnosis not present

## 2024-01-01 DIAGNOSIS — R197 Diarrhea, unspecified: Secondary | ICD-10-CM | POA: Insufficient documentation

## 2024-01-01 LAB — COMPREHENSIVE METABOLIC PANEL WITH GFR
ALT: 53 U/L — ABNORMAL HIGH (ref 0–44)
AST: 42 U/L — ABNORMAL HIGH (ref 15–41)
Albumin: 4.1 g/dL (ref 3.5–5.0)
Alkaline Phosphatase: 55 U/L (ref 38–126)
Anion gap: 13 (ref 5–15)
BUN: 12 mg/dL (ref 6–20)
CO2: 24 mmol/L (ref 22–32)
Calcium: 9.2 mg/dL (ref 8.9–10.3)
Chloride: 103 mmol/L (ref 98–111)
Creatinine, Ser: 0.93 mg/dL (ref 0.61–1.24)
GFR, Estimated: >60 mL/min (ref >60)
Glucose, Bld: 108 mg/dL — ABNORMAL HIGH (ref 70–99)
Potassium: 3.3 mmol/L — ABNORMAL LOW (ref 3.5–5.1)
Sodium: 140 mmol/L (ref 135–145)
Total Bilirubin: 0.6 mg/dL (ref 0.0–1.2)
Total Protein: 7.4 g/dL (ref 6.5–8.1)

## 2024-01-01 LAB — CBC
HCT: 43 % (ref 39.0–52.0)
Hemoglobin: 14.2 g/dL (ref 13.0–17.0)
MCH: 27.7 pg (ref 26.0–34.0)
MCHC: 33 g/dL (ref 30.0–36.0)
MCV: 83.8 fL (ref 80.0–100.0)
Platelets: 247 K/uL (ref 150–400)
RBC: 5.13 MIL/uL (ref 4.22–5.81)
RDW: 13.7 % (ref 11.5–15.5)
WBC: 8.1 K/uL (ref 4.0–10.5)
nRBC: 0 % (ref 0.0–0.2)

## 2024-01-01 LAB — LIPASE, BLOOD: Lipase: 59 U/L — ABNORMAL HIGH (ref 11–51)

## 2024-01-01 MED ORDER — LOPERAMIDE HCL 2 MG PO CAPS
4.0000 mg | ORAL_CAPSULE | Freq: Once | ORAL | Status: AC
  Administered 2024-01-01: 4 mg via ORAL
  Filled 2024-01-01: qty 2

## 2024-01-01 MED ORDER — AMLODIPINE BESYLATE 2.5 MG PO TABS: 2.5000 mg | ORAL_TABLET | Freq: Every day | ORAL | 0 refills | Status: AC

## 2024-01-01 MED ORDER — ONDANSETRON HCL 4 MG/2ML IJ SOLN
4.0000 mg | Freq: Once | INTRAMUSCULAR | Status: AC
  Administered 2024-01-01: 4 mg via INTRAVENOUS
  Filled 2024-01-01: qty 2

## 2024-01-01 MED ORDER — LACTATED RINGERS IV BOLUS
1000.0000 mL | Freq: Once | INTRAVENOUS | Status: AC
  Administered 2024-01-01: 1000 mL via INTRAVENOUS

## 2024-01-01 NOTE — ED Provider Notes (Signed)
 [place] EMERGENCY DEPARTMENT AT [facility] Provider Note   CSN: [id number] Arrival date & time: 01/01/24  1626     Patient presents with: Diarrhea   Wesley Wheeler is a 33 y.o. male.   Patient is a 33 year old male with a prior history of hypertension but is not on any medications at this time who is presenting with the complaint of diarrhea.  He reports feeling completely fine yesterday and then woke up at 3 AM this morning with repetitive diarrhea at least every 2 hours all day long.  He denies any blood in his stool.  He is not aware of any food poisoning as other people of eaten what he has and they do not have symptoms.  He has had a low-grade temperature today and just felt queasy.  He has been able to eat some.  Denies any antibiotics in the last few months.  No travel outside of the [place] .  The history is provided by the patient.  Diarrhea      Prior to Admission medications   Medication Sig Start Date End Date Taking? Authorizing Provider  amLODipine  (NORVASC ) 2.5 MG tablet Take 1 tablet (2.5 mg total) by mouth daily. 01/01/24  Yes Doretha Folks, MD  benzonatate  (TESSALON ) 100 MG capsule Take 1 capsule (100 mg total) by mouth every 8 (eight) hours. 07/23/23   Hildegard Loge, PA-C  guaiFENesin -dextromethorphan (ROBITUSSIN DM) 100-10 MG/5ML syrup Take 10 mLs by mouth every 4 (four) hours as needed for cough. 05/16/22   Akula, Vijaya, MD  methocarbamol  (ROBAXIN ) 500 MG tablet Take 1 tablet (500 mg total) by mouth 2 (two) times daily. 09/24/22   Clark, Meghan R, PA-C  naproxen  (NAPROSYN ) 500 MG tablet Take 1 tablet (500 mg total) by mouth 2 (two) times daily. 10/15/22   Bero, Michael M, MD  nicotine  (NICODERM CQ  - DOSED IN MG/24 HOURS) 14 mg/24hr patch Place 1 patch (14 mg total) onto the skin daily. 05/17/22   Akula, Vijaya, MD  albuterol  (VENTOLIN  HFA) 108 (90 Base) MCG/ACT inhaler Inhale 1-2 puffs into the lungs every 6 (six) hours as needed for wheezing or shortness of  breath. 04/11/19 07/21/19  Van Knee, MD  fluticasone  (FLONASE ) 50 MCG/ACT nasal spray Place 2 sprays into both nostrils daily. 04/11/19 07/21/19  Van Knee, MD    Allergies: Amoxicillin    Review of Systems  Gastrointestinal:  Positive for diarrhea.    Updated Vital Signs BP 136/78 (BP Location: Left Arm)   Pulse 89   Temp 99.2 F (37.3 C) (Oral)   Resp 19   Ht 6' 3 (1.905 m)   Wt (!) 149.7 kg   SpO2 98%   BMI 41.25 kg/m   Physical Exam Vitals and nursing note reviewed.  Constitutional:      General: He is not in acute distress.    Appearance: He is well-developed.  HENT:     Head: Normocephalic and atraumatic.  Eyes:     Conjunctiva/sclera: Conjunctivae normal.     Pupils: Pupils are equal, round, and reactive to light.  Cardiovascular:     Rate and Rhythm: Normal rate and regular rhythm.     Heart sounds: No murmur heard. Pulmonary:     Effort: Pulmonary effort is normal. No respiratory distress.     Breath sounds: Normal breath sounds. No wheezing or rales.  Abdominal:     General: There is no distension.     Palpations: Abdomen is soft.     Tenderness: There is  no abdominal tenderness. There is no guarding or rebound.  Musculoskeletal:        General: No tenderness. Normal range of motion.     Cervical back: Normal range of motion and neck supple.  Skin:    General: Skin is warm and dry.     Findings: No erythema or rash.  Neurological:     Mental Status: He is alert and oriented to person, place, and time.  Psychiatric:        Behavior: Behavior normal.     (all labs ordered are listed, but only abnormal results are displayed) Labs Reviewed  LIPASE, BLOOD - Abnormal; Notable for the following components:      Result Value   Lipase 59 (*)    All other components within normal limits  COMPREHENSIVE METABOLIC PANEL WITH GFR - Abnormal; Notable for the following components:   Potassium 3.3 (*)    Glucose, Bld 108 (*)    AST 42 (*)    ALT  53 (*)    All other components within normal limits  CBC    EKG: None  Radiology: No results found.   Procedures   Medications Ordered in the ED  lactated ringers  bolus 1,000 mL (1,000 mLs Intravenous New Bag/Given 01/01/24 1708)  ondansetron  (ZOFRAN ) injection 4 mg (4 mg Intravenous Given 01/01/24 1708)  loperamide  (IMODIUM ) capsule 4 mg (4 mg Oral Given 01/01/24 1659)                                    Medical Decision Making Amount and/or Complexity of Data Reviewed Labs: ordered. Decision-making details documented in ED Course.  Risk Prescription drug management.   Patient presenting today with diarrhea since 3 AM this morning.  Benign abdominal exam.  With no findings suggestive for appendicitis, diverticulitis or cholecystitis.  Lower suspicion for bacterial cause as patient has not had any travel, recent antibiotic use or high suspicion for food poisoning.  Suspect most likely viral etiology.  He has no prior history of similar with a low suspicion for IBD.  Will check electrolytes and ensure no evidence of hypokalemia at this time more AKI/dehydration.  He was given IV fluids, antiemetics and Imodium . I independently interpreted patient's labs and lipase is mildly elevated at 59, CMP with mild hypokalemia of 3.3 but otherwise normal creatinine.  LFTs minimally elevated at AST of 42 ALT of 53 and CBC within normal limits. Patient remains well-appearing.  At this time feel that patient is stable for discharge home.     Final diagnoses:  Diarrhea of presumed infectious origin    ED Discharge Orders          Ordered    amLODipine  (NORVASC ) 2.5 MG tablet  Daily        01/01/24 1803               Doretha Folks, MD 01/01/24 1805

## 2024-01-01 NOTE — ED Triage Notes (Signed)
 Pt endorses constant diarrhea since 3am with greater than 10 episodes today. Has felt tired all day with no energy. Unknown sick contacts.Denies abdominal pain but does endorse nausea. No fever.

## 2024-01-01 NOTE — Discharge Instructions (Addendum)
 Blood work looks good today except for just mild decreased potassium from the diarrhea today.  Make sure you are eating foods with a lot of potassium in them.  Also you were given a prescription for a low-dose blood pressure medication.  This is actually what you have been given in the past.  You only need to take this blood pressure medication if you are having consistently elevated blood pressure readings greater than 140/80 over the next few weeks.  Take your blood pressure once or twice a week and write down the number to see where your trend is.

## 2024-01-09 DIAGNOSIS — G4733 Obstructive sleep apnea (adult) (pediatric): Secondary | ICD-10-CM | POA: Diagnosis not present

## 2024-01-12 DIAGNOSIS — G4733 Obstructive sleep apnea (adult) (pediatric): Secondary | ICD-10-CM | POA: Diagnosis not present

## 2024-01-20 NOTE — Progress Notes (Deleted)
 New Patient Note  RE: Wesley Wheeler MRN: [id number] DOB: 02/27/1991 Date of Office Visit: 01/21/2024  Consult requested by: Valentin Skates, DO Primary care provider: Patient, No Pcp Per  Chief Complaint: No chief complaint on file.  History of Present Illness: I had the pleasure of seeing [name] for initial evaluation at the [facility] on 01/21/2024. He is a 33 y.o. male, who is referred here by Valentin Skates, DO for the evaluation of allergic rhinitis.  Discussed the use of AI scribe software for clinical note transcription with the patient, who gave verbal consent to proceed.  History of Present Illness             He reports symptoms of ***. Symptoms have been going on for *** years. The symptoms are present *** all year around with worsening in ***. Other triggers include exposure to ***. Anosmia: ***. Headache: ***. He has used *** with ***fair improvement in symptoms. Sinus infections: ***. Previous work up includes: ***. Previous ENT evaluation: ***. Previous sinus imaging: ***. History of nasal polyps: ***. Last eye exam: ***. History of reflux: ***.  Assessment and Plan: Wesley Wheeler is a 33 y.o. male with: ***  Assessment and Plan               No follow-ups on file.  No orders of the defined types were placed in this encounter.  Lab Orders  No laboratory test(s) ordered today    Other allergy screening: Asthma: {Blank single:19197::yes,no} Rhino conjunctivitis: {Blank single:19197::yes,no} Food allergy: {Blank single:19197::yes,no} Medication allergy: {Blank single:19197::yes,no} Hymenoptera allergy: {Blank single:19197::yes,no} Urticaria: {Blank single:19197::yes,no} Eczema:{Blank single:19197::yes,no} History of recurrent infections suggestive of immunodeficency: {Blank single:19197::yes,no}  Diagnostics: Spirometry:  Tracings reviewed. His effort: {Blank single:19197::Good  reproducible efforts.,It was hard to get consistent efforts and there is a question as to whether this reflects a maximal maneuver.,Poor effort, data can not be interpreted.} FVC: ***L FEV1: ***L, ***% predicted FEV1/FVC ratio: ***% Interpretation: {Blank single:19197::Spirometry consistent with mild obstructive disease,Spirometry consistent with moderate obstructive disease,Spirometry consistent with severe obstructive disease,Spirometry consistent with possible restrictive disease,Spirometry consistent with mixed obstructive and restrictive disease,Spirometry uninterpretable due to technique,Spirometry consistent with normal pattern,No overt abnormalities noted given today's efforts}.  Please see scanned spirometry results for details.  Skin Testing: {Blank single:19197::Select foods,Environmental allergy panel,Environmental allergy panel and select foods,Food allergy panel,None,Deferred due to recent antihistamines use}. *** Results discussed with patient/family.   Past Medical History: Patient Active Problem List   Diagnosis Date Noted  . Acute respiratory failure with hypoxia (HCC) 05/14/2022  . CAP (community acquired pneumonia) 05/14/2022  . OSA (obstructive sleep apnea) 05/14/2022  . Obesity, Class III, BMI 40-49.9 (morbid obesity) 05/14/2022  . Reactive airway disease 05/14/2022  . Tobacco abuse 05/14/2022   Past Medical History:  Diagnosis Date  . Hypertension   . Sleep apnea    Past Surgical History: Past Surgical History:  Procedure Laterality Date  . NASAL SINUS SURGERY    . NOSE SURGERY     benign tumor   Medication List:  Current Outpatient Medications  Medication Sig Dispense Refill  . amLODipine  (NORVASC ) 2.5 MG tablet Take 1 tablet (2.5 mg total) by mouth daily. 30 tablet 0  . benzonatate  (TESSALON ) 100 MG capsule Take 1 capsule (100 mg total) by mouth every 8 (eight) hours. 21 capsule 0  . guaiFENesin -dextromethorphan  (ROBITUSSIN DM) 100-10 MG/5ML syrup Take 10 mLs by mouth every 4 (four) hours as needed for cough. 118 mL 0  .  methocarbamol  (ROBAXIN ) 500 MG tablet Take 1 tablet (500 mg total) by mouth 2 (two) times daily. 20 tablet 0  . naproxen  (NAPROSYN ) 500 MG tablet Take 1 tablet (500 mg total) by mouth 2 (two) times daily. 30 tablet 0  . nicotine  (NICODERM CQ  - DOSED IN MG/24 HOURS) 14 mg/24hr patch Place 1 patch (14 mg total) onto the skin daily. 28 patch 0   No current facility-administered medications for this visit.   Allergies: Allergies  Allergen Reactions  . Amoxicillin    Social History: Social History   Socioeconomic History  . Marital status: Married    Spouse name: Not on file  . Number of children: Not on file  . Years of education: Not on file  . Highest education level: Not on file  Occupational History  . Not on file  Tobacco Use  . Smoking status: Every Day    Current packs/day: 0.50    Types: Cigars, Cigarettes  . Smokeless tobacco: Never  Vaping Use  . Vaping status: Some Days  Substance and Sexual Activity  . Alcohol use: Yes    Comment: Occ  . Drug use: No  . Sexual activity: Yes  Other Topics Concern  . Not on file  Social History Narrative  . Not on file   Social Drivers of Health   Financial Resource Strain: Not on file  Food Insecurity: No Food Insecurity (05/14/2022)   Hunger Vital Sign   . Worried About Programme researcher, broadcasting/film/video in the Last Year: Never true   . Ran Out of Food in the Last Year: Never true  Transportation Needs: No Transportation Needs (05/14/2022)   PRAPARE - Transportation   . Lack of Transportation (Medical): No   . Lack of Transportation (Non-Medical): No  Physical Activity: Not on file  Stress: Not on file  Social Connections: Not on file   Lives in a ***. Smoking: *** Occupation: ***  Environmental HistorySurveyor, minerals in the house: Network engineer in the family room: {Blank  single:19197::yes,no} Carpet in the bedroom: {Blank single:19197::yes,no} Heating: {Blank single:19197::electric,gas,heat pump} Cooling: {Blank single:19197::central,window,heat pump} Pet: {Blank single:19197::yes ***,no}  Family History: Family History  Problem Relation Age of Onset  . Hypertension Mother   . Diabetes Father    Problem                               Relation Asthma                                   *** Eczema                                *** Food allergy                          *** Allergic rhino conjunctivitis     ***  Review of Systems  Constitutional:  Negative for appetite change, chills, fever and unexpected weight change.  HENT:  Negative for congestion and rhinorrhea.   Eyes:  Negative for itching.  Respiratory:  Negative for cough, chest tightness, shortness of breath and wheezing.   Cardiovascular:  Negative for chest pain.  Gastrointestinal:  Negative for abdominal pain.  Genitourinary:  Negative for difficulty urinating.  Skin:  Negative for rash.  Neurological:  Negative for headaches.    Objective: There were no vitals taken for this visit. There is no height or weight on file to calculate BMI. Physical Exam Vitals and nursing note reviewed.  Constitutional:      Appearance: Normal appearance. He is well-developed.  HENT:     Head: Normocephalic and atraumatic.     Right Ear: Tympanic membrane and external ear normal.     Left Ear: Tympanic membrane and external ear normal.     Nose: Nose normal.     Mouth/Throat:     Mouth: Mucous membranes are moist.     Pharynx: Oropharynx is clear.  Eyes:     Conjunctiva/sclera: Conjunctivae normal.  Cardiovascular:     Rate and Rhythm: Normal rate and regular rhythm.     Heart sounds: Normal heart sounds. No murmur heard.    No friction rub. No gallop.  Pulmonary:     Effort: Pulmonary effort is normal.     Breath sounds: Normal breath sounds. No wheezing, rhonchi or  rales.  Musculoskeletal:     Cervical back: Neck supple.  Skin:    General: Skin is warm.     Findings: No rash.  Neurological:     Mental Status: He is alert and oriented to person, place, and time.  Psychiatric:        Behavior: Behavior normal.   The plan was reviewed with the patient/family, and all questions/concerned were addressed.  It was my pleasure to see Trestan today and participate in his care. Please feel free to contact me with any questions or concerns.  Sincerely,  Orlan Cramp, DO Allergy & Immunology  [facility]  [facility] office: [phone] [facility] office: [phone]

## 2024-01-21 ENCOUNTER — Ambulatory Visit: Payer: Self-pay | Admitting: Allergy

## 2024-02-06 DIAGNOSIS — G4733 Obstructive sleep apnea (adult) (pediatric): Secondary | ICD-10-CM | POA: Diagnosis not present

## 2024-03-08 DIAGNOSIS — G4733 Obstructive sleep apnea (adult) (pediatric): Secondary | ICD-10-CM | POA: Diagnosis not present

## 2024-05-14 DIAGNOSIS — G4733 Obstructive sleep apnea (adult) (pediatric): Secondary | ICD-10-CM | POA: Diagnosis not present

## 2024-06-07 DIAGNOSIS — G4733 Obstructive sleep apnea (adult) (pediatric): Secondary | ICD-10-CM | POA: Diagnosis not present
